# Patient Record
Sex: Male | Born: 1995
Health system: Southern US, Community
[De-identification: ages and names within clinical notes are randomized; demographics above are authoritative.]

## PROBLEM LIST (undated history)

## (undated) DIAGNOSIS — R1115 Cyclical vomiting syndrome unrelated to migraine: Secondary | ICD-10-CM

---
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
## 2018-07-15 ENCOUNTER — Other Ambulatory Visit: Payer: Self-pay

## 2018-07-15 ENCOUNTER — Emergency Department (HOSPITAL_BASED_OUTPATIENT_CLINIC_OR_DEPARTMENT_OTHER)
Admission: EM | Admit: 2018-07-15 | Discharge: 2018-07-15 | Disposition: A | Discharge status: Home / Self Care | Payer: Self-pay | Attending: Emergency Medicine | Admitting: Emergency Medicine

## 2018-07-15 ENCOUNTER — Encounter (HOSPITAL_BASED_OUTPATIENT_CLINIC_OR_DEPARTMENT_OTHER): Payer: Self-pay | Admitting: Student

## 2018-07-15 DIAGNOSIS — F17200 Nicotine dependence, unspecified, uncomplicated: Secondary | ICD-10-CM | POA: Insufficient documentation

## 2018-07-15 DIAGNOSIS — R112 Nausea with vomiting, unspecified: Secondary | ICD-10-CM | POA: Insufficient documentation

## 2018-07-15 MED ORDER — ONDANSETRON 4 MG PO TBDP: 4.0000 mg | ORAL_TABLET | Freq: Three times a day (TID) | ORAL | 0 refills | Status: DC | PRN

## 2018-07-15 MED ORDER — ONDANSETRON 4 MG PO TBDP
4.0000 mg | ORAL_TABLET | Freq: Once | ORAL | Status: AC
  Administered 2018-07-15: 4 mg via ORAL
  Filled 2018-07-15: qty 1

## 2018-07-15 MED ORDER — FAMOTIDINE 20 MG PO TABS: 20.0000 mg | ORAL_TABLET | Freq: Two times a day (BID) | ORAL | 0 refills | Status: DC | PRN

## 2018-07-15 MED FILL — FAMOTIDINE 20 MG TABLET: 20 | 5 days supply | Qty: 10 | Fill #0

## 2018-07-15 MED FILL — ONDANSETRON ODT 4 MG TABLET: 4 | 1 days supply | Qty: 5 | Fill #0

## 2018-07-15 NOTE — ED Provider Notes (Signed)
[facility] EMERGENCY DEPARTMENT Provider Note   CSN: [id number] Arrival date & time: 07/15/18  1154    History   Chief Complaint Chief Complaint  Patient presents with   Emesis    HPI Sean Benton is a 23 y.o. male without significant past medical hx who presents to the ED with complaints of N/V x  6 days. Patient notes that last Friday evening 03/20 he was consuming alcohol and smoking marijuana at a party and later that evening he had an episode of emesis. He states that since that evening each afternoon he has 2-4 episodes of non bloody, non bilious emesis. He admits to continued marijuana use several times since Friday evening. He is able to tolerate PO fluids throughout the morning though. He notes that he gets queezy w/ increased nausea prior to vomiting and has dyspnea during vomiting episodes otherwise he is not having any shortness of breath. Last BM was Friday and was normal, he states he hasn't kept down any food, he is still passing gas. Denies fever, chills, hematemesis, abdominal pain, chest pain, diarrhea, melena, hematochezia, dysuria, testicular pain/swelling, cough, fever or URI sxs. No one sick w/ simliar sxs. No recent travel, hospitalizations, or abx. No history of prior abdominal surgeries.      HPI  History reviewed. No pertinent past medical history.  There are no active problems to display for this patient.   History reviewed. No pertinent surgical history.      Home Medications    Prior to Admission medications   Not on File    Family History History reviewed. No pertinent family history.  Social History Social History   Tobacco Use   Smoking status: Current Every Day Smoker   Smokeless tobacco: Never Used  Substance Use Topics   Alcohol use: Yes   Drug use: Yes    Types: Marijuana     Allergies   Patient has no known allergies.   Review of Systems Review of Systems  Constitutional: Negative for chills and fever.    HENT: Negative for congestion, ear pain and sore throat.   Respiratory: Positive for shortness of breath (w/ vomiting otherwise none). Negative for cough, wheezing and stridor.   Cardiovascular: Negative for chest pain.  Gastrointestinal: Positive for nausea and vomiting. Negative for abdominal distention, abdominal pain, anal bleeding, blood in stool and diarrhea.  Genitourinary: Negative for dysuria, scrotal swelling and testicular pain.  All other systems reviewed and are negative.    Physical Exam Updated Vital Signs BP (!) 144/90    Pulse (!) 50    Temp 98.6 F (37 C) (Oral)    Resp 16    Ht 5\' 10"  (1.778 m)    Wt 83.9 kg    SpO2 100%    BMI 26.54 kg/m   Physical Exam Vitals signs and nursing note reviewed.  Constitutional:      General: He is not in acute distress.    Appearance: He is well-developed. He is not toxic-appearing.  HENT:     Head: Normocephalic and atraumatic.     Mouth/Throat:     Mouth: Mucous membranes are moist.  Eyes:     General:        Right eye: No discharge.        Left eye: No discharge.     Conjunctiva/sclera: Conjunctivae normal.  Neck:     Musculoskeletal: Neck supple.  Cardiovascular:     Rate and Rhythm: Regular rhythm. Bradycardia present.  Pulmonary:  Effort: Pulmonary effort is normal. No respiratory distress.     Breath sounds: Normal breath sounds. No wheezing, rhonchi or rales.  Abdominal:     General: There is no distension.     Palpations: Abdomen is soft.     Tenderness: There is no abdominal tenderness. There is no guarding or rebound.  Skin:    General: Skin is warm and dry.     Findings: No rash.  Neurological:     Mental Status: He is alert.     Comments: Clear speech.   Psychiatric:        Behavior: Behavior normal.      ED Treatments / Results  Labs (all labs ordered are listed, but only abnormal results are displayed) Labs Reviewed - No data to display  EKG None  Radiology No results  found.  Procedures Procedures (including critical care time)  Medications Ordered in ED Medications  ondansetron (ZOFRAN-ODT) disintegrating tablet 4 mg (has no administration in time range)     Initial Impression / Assessment and Plan / ED Course  I have reviewed the triage vital signs and the nursing notes.  Pertinent labs & imaging results that were available during my care of the patient were reviewed by me and considered in my medical decision making (see chart for details).   Patient presents to the ED w/ complaints of N/V since consuming EtOH and smoking marijuana 6 days prior. Patient nontoxic appearing, no apparent distress, vitals w/ mild bradycardia & HTN that appear similar to vitals and prior ER visit in January of this year. His reported shortness of breath is during his episodes of vomiting/dry heaving, does not seem like true dyspnea, no respiratory distress, normal SpO2/RR, lungs CTA. No chest pain, cough, or fever. He does not appear clinically dehydrated, mucous membranes are moist, he is not tachycardic or hypotensive- able to tolerate PO fluids each morning this week and drinking Gatorade, do not suspect significant electrolyte derangement. He has a nontender abdomen without peritoneal signs & is afebrile, doubt cholecystitis, pancreatitis, or appendicitis, while he has not had a bowel movement he continues to pass gas with nontender abdomen, doubt perforation, obstruction or other surgical cause. High suspicion for substance related emesis given EtOH & marijuana prior to onset w/ continued frequent marijuana use w/ continued vomiting, but no definitive etiology. He is tolerating PO in the emergency department without difficulty. Discussed findings & plan of care with supervising physician Dr. Adela Lank in agreement with discharge home with zofran & H2 blocker, no further ER work-up, and PCP follow up. I discussed treatment plan including medications & marijuana cessation, need for  follow-up, and return precautions with the patient. Provided opportunity for questions, patient confirmed understanding and is in agreement with plan.     Final Clinical Impressions(s) / ED Diagnoses   Final diagnoses:  Non-intractable vomiting with nausea, unspecified vomiting type    ED Discharge Orders         Ordered    ondansetron (ZOFRAN ODT) 4 MG disintegrating tablet  Every 8 hours PRN     07/15/18 1226    famotidine (PEPCID) 20 MG tablet  2 times daily PRN     07/15/18 [address] [place] R, PA-C 07/15/18 1230    Melene Plan, DO 07/15/18 1329

## 2018-07-15 NOTE — Discharge Instructions (Addendum)
You were seen in the ER today for nausea/vomiting.  Please stop using marijuana- see attached handout regarding marijuana use in relation to vomiting.  We are sending you home with zofran & pepcid to help with nausea/vomiting.  Take zofran every 8 hours as needed for nausea/vomiting.  Take pepcid once in the morning and once in the evening as needed for reflux/heartburn/indigestion.   We have prescribed you new medication(s) today. Discuss the medications prescribed today with your pharmacist as they can have adverse effects and interactions with your other medicines including over the counter and prescribed medications. Seek medical evaluation if you start to experience new or abnormal symptoms after taking one of these medicines, seek care immediately if you start to experience difficulty breathing, feeling of your throat closing, facial swelling, or rash as these could be indications of a more serious allergic reaction  Follow up with primary care within 3 days. Return to the ER for new or worsening symptoms including but not limited to inability to keep fluids down, abdominal pain, blood in vomit/stool, inability to pass gas, fever or any other concerns.   Also follow up with primary care for a blood pressure recheck as it is elevated today and has been with prior ER visits.  Blood pressure (!) 144/90, pulse (!) 50, temperature 98.6 F (37 C), temperature source Oral, resp. rate 16, height 5\' 10"  (1.778 m), weight 83.9 kg, SpO2 100 %.

## 2018-07-15 NOTE — ED Triage Notes (Signed)
Reports vomiting since Friday.  Denies diarrhea but states no bowel movement since Friday.  Reports that he has also had shortness of breath intermittently.  Ambulatory to room in NAD, drinking gatorade.  Explained to patient that he would need to remain NPO.

## 2018-08-30 ENCOUNTER — Other Ambulatory Visit: Payer: Self-pay

## 2018-08-30 ENCOUNTER — Encounter (HOSPITAL_BASED_OUTPATIENT_CLINIC_OR_DEPARTMENT_OTHER): Payer: Self-pay

## 2018-08-30 ENCOUNTER — Emergency Department (HOSPITAL_BASED_OUTPATIENT_CLINIC_OR_DEPARTMENT_OTHER)
Admission: EM | Admit: 2018-08-30 | Discharge: 2018-08-30 | Disposition: A | Discharge status: Home / Self Care | Payer: Self-pay | Attending: Emergency Medicine | Admitting: Emergency Medicine

## 2018-08-30 DIAGNOSIS — R1013 Epigastric pain: Secondary | ICD-10-CM | POA: Insufficient documentation

## 2018-08-30 DIAGNOSIS — R112 Nausea with vomiting, unspecified: Secondary | ICD-10-CM | POA: Insufficient documentation

## 2018-08-30 DIAGNOSIS — F17210 Nicotine dependence, cigarettes, uncomplicated: Secondary | ICD-10-CM | POA: Insufficient documentation

## 2018-08-30 DIAGNOSIS — R197 Diarrhea, unspecified: Secondary | ICD-10-CM | POA: Insufficient documentation

## 2018-08-30 LAB — COMPREHENSIVE METABOLIC PANEL
ALT: 16 U/L (ref 0–44)
AST: 24 U/L (ref 15–41)
Albumin: 4.7 g/dL (ref 3.5–5.0)
Alkaline Phosphatase: 62 U/L (ref 38–126)
Anion gap: 8 (ref 5–15)
BUN: 11 mg/dL (ref 6–20)
CO2: 22 mmol/L (ref 22–32)
Calcium: 9.4 mg/dL (ref 8.9–10.3)
Chloride: 108 mmol/L (ref 98–111)
Creatinine, Ser: 0.9 mg/dL (ref 0.61–1.24)
GFR calc Af Amer: >60 mL/min (ref >60)
GFR calc non Af Amer: >60 mL/min (ref >60)
Glucose, Bld: 137 mg/dL — ABNORMAL HIGH (ref 70–99)
Potassium: 3.6 mmol/L (ref 3.5–5.1)
Sodium: 138 mmol/L (ref 135–145)
Total Bilirubin: 0.6 mg/dL (ref 0.3–1.2)
Total Protein: 7.8 g/dL (ref 6.5–8.1)

## 2018-08-30 LAB — URINALYSIS, ROUTINE W REFLEX MICROSCOPIC
Bilirubin Urine: NEGATIVE
Glucose, UA: NEGATIVE mg/dL
Ketones, ur: NEGATIVE mg/dL
Leukocytes,Ua: NEGATIVE
Nitrite: NEGATIVE
Protein, ur: 100 mg/dL — AB
Specific Gravity, Urine: >1.03 — ABNORMAL HIGH (ref 1.005–1.030)
pH: 6.5 (ref 5.0–8.0)

## 2018-08-30 LAB — URINALYSIS, MICROSCOPIC (REFLEX)

## 2018-08-30 LAB — CBC WITH DIFFERENTIAL/PLATELET
Abs Immature Granulocytes: 0.02 10*3/uL (ref 0.00–0.07)
Basophils Absolute: 0 10*3/uL (ref 0.0–0.1)
Basophils Relative: 0 %
Eosinophils Absolute: 0 10*3/uL (ref 0.0–0.5)
Eosinophils Relative: 0 %
HCT: 40.6 % (ref 39.0–52.0)
Hemoglobin: 12.5 g/dL — ABNORMAL LOW (ref 13.0–17.0)
Immature Granulocytes: 0 %
Lymphocytes Relative: 9 %
Lymphs Abs: 0.7 10*3/uL (ref 0.7–4.0)
MCH: 22.9 pg — ABNORMAL LOW (ref 26.0–34.0)
MCHC: 30.8 g/dL (ref 30.0–36.0)
MCV: 74.2 fL — ABNORMAL LOW (ref 80.0–100.0)
Monocytes Absolute: 0.3 10*3/uL (ref 0.1–1.0)
Monocytes Relative: 4 %
Neutro Abs: 6.9 10*3/uL (ref 1.7–7.7)
Neutrophils Relative %: 87 %
Platelets: 268 10*3/uL (ref 150–400)
RBC: 5.47 MIL/uL (ref 4.22–5.81)
RDW: 15.3 % (ref 11.5–15.5)
WBC: 8 10*3/uL (ref 4.0–10.5)
nRBC: 0 % (ref 0.0–0.2)

## 2018-08-30 LAB — LIPASE, BLOOD: Lipase: 25 U/L (ref 11–51)

## 2018-08-30 MED ORDER — ONDANSETRON HCL 4 MG/2ML IJ SOLN
4.0000 mg | Freq: Once | INTRAMUSCULAR | Status: AC
  Administered 2018-08-30: 4 mg via INTRAVENOUS
  Filled 2018-08-30: qty 2

## 2018-08-30 MED ORDER — HALOPERIDOL LACTATE 5 MG/ML IJ SOLN
5.0000 mg | Freq: Once | INTRAMUSCULAR | Status: AC
  Administered 2018-08-30: 17:00:00 5 mg via INTRAVENOUS
  Filled 2018-08-30: qty 1

## 2018-08-30 MED ORDER — ONDANSETRON HCL 4 MG PO TABS: 4.0000 mg | ORAL_TABLET | Freq: Four times a day (QID) | ORAL | 0 refills | Status: DC

## 2018-08-30 MED ORDER — SODIUM CHLORIDE 0.9 % IV BOLUS
1000.0000 mL | Freq: Once | INTRAVENOUS | Status: AC
  Administered 2018-08-30: 16:00:00 1000 mL via INTRAVENOUS

## 2018-08-30 NOTE — Discharge Instructions (Signed)
Avoid smoking marijuana, this may be causing your symptoms. Drink plenty of water. Thank you for allowing me to care for you today. Please return to the emergency department if you have new or worsening symptoms. Take your medications as instructed.

## 2018-08-30 NOTE — ED Notes (Signed)
Pt still vomiting despite medication

## 2018-08-30 NOTE — ED Provider Notes (Signed)
[facility] EMERGENCY DEPARTMENT Provider Note   CSN: [id number] Arrival date & time: 08/30/18  1523    History   Chief Complaint Chief Complaint  Patient presents with   Abdominal Pain    HPI Sean Benton is a 23 y.o. male.     23 year old male with no significant past medical history presenting to the emergency department for abdominal pain, nausea vomiting, diarrhea.  Reports that this started abruptly this morning.  Reports history of the same in the past which was thought to be related to [facility] hyperemesis.  He reports that he smokes marijuana twice a day.  Reports that he had several episodes of nonbloody stool and emesis.  Reports that he has some epigastric pain.  Denies any fever, sick contacts, cough, dysuria.     History reviewed. No pertinent past medical history.  There are no active problems to display for this patient.   History reviewed. No pertinent surgical history.      Home Medications    Prior to Admission medications   Medication Sig Start Date End Date Taking? Authorizing Provider  famotidine (PEPCID) 20 MG tablet Take 1 tablet (20 mg total) by mouth 2 (two) times daily as needed for heartburn or indigestion. 07/15/18   Petrucelli, Samantha R, PA-C  ondansetron (ZOFRAN ODT) 4 MG disintegrating tablet Take 1 tablet (4 mg total) by mouth every 8 (eight) hours as needed for nausea or vomiting. 07/15/18   Petrucelli, Samantha R, PA-C  ondansetron (ZOFRAN) 4 MG tablet Take 1 tablet (4 mg total) by mouth every 6 (six) hours. 08/30/18   Arlyn Dunning, PA-C    Family History No family history on file.  Social History Social History   Tobacco Use   Smoking status: Current Every Day Smoker    Types: Cigarettes   Smokeless tobacco: Never Used  Substance Use Topics   Alcohol use: Yes    Comment: occ   Drug use: Yes    Types: Marijuana     Allergies   Patient has no known allergies.   Review of Systems Review of Systems    Constitutional: Positive for appetite change. Negative for activity change, fatigue and fever.  HENT: Negative for congestion and rhinorrhea.   Respiratory: Negative for cough and shortness of breath.   Cardiovascular: Negative for chest pain and palpitations.  Gastrointestinal: Positive for abdominal pain, diarrhea, nausea and vomiting. Negative for abdominal distention, anal bleeding, blood in stool, constipation and rectal pain.  Genitourinary: Negative for discharge, dysuria, hematuria and testicular pain.  Musculoskeletal: Negative for arthralgias and back pain.  Skin: Negative for rash.  Allergic/Immunologic: Negative for immunocompromised state.  Neurological: Negative for dizziness, light-headedness and headaches.     Physical Exam Updated Vital Signs BP 137/71 (BP Location: Right Arm)    Pulse (!) 50    Temp 97.9 F (36.6 C) (Oral)    Resp 20    Ht  (1.778 m)    Wt 83.9 kg    SpO2 99%    BMI 26.54 kg/m   Physical Exam Vitals signs and nursing note reviewed.  Constitutional:      Appearance: He is well-developed.  HENT:     Head: Normocephalic and atraumatic.  Eyes:     Conjunctiva/sclera: Conjunctivae normal.  Neck:     Musculoskeletal: Neck supple.  Cardiovascular:     Rate and Rhythm: Normal rate and regular rhythm.     Heart sounds: No murmur.  Pulmonary:     Effort: Pulmonary  effort is normal. No respiratory distress.     Breath sounds: Normal breath sounds.  Abdominal:     Palpations: Abdomen is soft.     Tenderness: There is generalized abdominal tenderness and tenderness in the epigastric area. There is no right CVA tenderness, left CVA tenderness, guarding or rebound. Negative signs include Murphy's sign and McBurney's sign.  Skin:    General: Skin is warm and dry.  Neurological:     Mental Status: He is alert.  Psychiatric:        Mood and Affect: Mood normal.      ED Treatments / Results  Labs (all labs ordered are listed, but only abnormal  results are displayed) Labs Reviewed  URINALYSIS, ROUTINE W REFLEX MICROSCOPIC - Abnormal; Notable for the following components:      Result Value   Specific Gravity, Urine >1.030 (*)    Hgb urine dipstick TRACE (*)    Protein, ur 100 (*)    All other components within normal limits  URINALYSIS, MICROSCOPIC (REFLEX) - Abnormal; Notable for the following components:   Bacteria, UA FEW (*)    All other components within normal limits  CBC WITH DIFFERENTIAL/PLATELET - Abnormal; Notable for the following components:   Hemoglobin 12.5 (*)    MCV 74.2 (*)    MCH 22.9 (*)    All other components within normal limits  COMPREHENSIVE METABOLIC PANEL - Abnormal; Notable for the following components:   Glucose, Bld 137 (*)    All other components within normal limits  LIPASE, BLOOD  CBC WITH DIFFERENTIAL/PLATELET    EKG None  Radiology No results found.  Procedures Procedures (including critical care time)  Medications Ordered in ED Medications  ondansetron (ZOFRAN) injection 4 mg (4 mg Intravenous Given 08/30/18 1605)  sodium chloride 0.9 % bolus 1,000 mL (1,000 mLs Intravenous New Bag/Given 08/30/18 1606)  haloperidol lactate (HALDOL) injection 5 mg (5 mg Intravenous Given 08/30/18 1650)     Initial Impression / Assessment and Plan / ED Course  I have reviewed the triage vital signs and the nursing notes.  Pertinent labs & imaging results that were available during my care of the patient were reviewed by me and considered in my medical decision making (see chart for details).  Clinical Course as of Aug 29 1713  Mon Aug 30, 2018  [id number] 23 year old with no past medical history presenting with nausea, vomiting, diarrhea which started abruptly since this morning.  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  Reports smoking marijuana 2 times every day.  On my exam belly is very soft and nontender.  I will  obtain labs, give zofran   [KM]  1711 Patient received zofan and haldol and now is improved without vomiting. Labs are unremarkable and belly remains soft and nontender. Suspect gastroenteritis vs THC hyperemesis. Will d/c with zofran and strict return precautions   [KM]    Clinical Course User Index [KM] Arlyn DunningMcLean, Yannis Broce A, PA-C      Based on review of vitals, medical screening exam, lab work and/or imaging, there does not appear to be an acute, emergent etiology for the patient's symptoms. Counseled pt on good return precautions and encouraged both PCP and ED follow-up as needed.  Prior to discharge, I also discussed incidental imaging findings with patient in detail and advised appropriate, recommended follow-up in detail.  Clinical Impression: 1. Non-intractable vomiting with nausea, unspecified vomiting type  Disposition: Discharge  Prior to providing a prescription for a controlled substance, I independently reviewed the patient's recent prescription history on the [place] Controlled Substance Reporting System. The patient had no recent or regular prescriptions and was deemed appropriate for a brief, less than 3 day prescription of narcotic for acute analgesia.  This note was prepared with assistance of Conservation officer, historic buildings. Occasional wrong-word or sound-a-like substitutions may have occurred due to the inherent limitations of voice recognition software.   Final Clinical Impressions(s) / ED Diagnoses   Final diagnoses:  Non-intractable vomiting with nausea, unspecified vomiting type    ED Discharge Orders         Ordered    ondansetron (ZOFRAN) 4 MG tablet  Every 6 hours     08/30/18 1714           Jeral Pinch 08/30/18 1715    Little, Ambrose Finland, MD 08/30/18 1724

## 2018-08-30 NOTE — ED Notes (Signed)
Pt has been seen several times for the same, c/o n/v with heavy marijuana use. Pt is able to hold down water at home, neglected to try any of the medication prescriptions that have been given to him prior to coming into the ED. Pt asking how long he has to be in the ED, nurse updated pt to plan of care

## 2018-08-30 NOTE — ED Triage Notes (Signed)
C/o abd pain n/v/d x today-to triage in w/c

## 2018-08-30 NOTE — ED Notes (Signed)
Pt left without his discharge instructions despite nurse explaining his discharge instructions, denies any needs at this time

## 2018-11-11 ENCOUNTER — Emergency Department (HOSPITAL_BASED_OUTPATIENT_CLINIC_OR_DEPARTMENT_OTHER)
Admission: EM | Admit: 2018-11-11 | Discharge: 2018-11-11 | Disposition: A | Discharge status: Home / Self Care | Payer: HRSA Program | Attending: Emergency Medicine | Admitting: Emergency Medicine

## 2018-11-11 ENCOUNTER — Emergency Department (HOSPITAL_BASED_OUTPATIENT_CLINIC_OR_DEPARTMENT_OTHER): Payer: HRSA Program

## 2018-11-11 ENCOUNTER — Encounter (HOSPITAL_BASED_OUTPATIENT_CLINIC_OR_DEPARTMENT_OTHER): Payer: Self-pay | Admitting: *Deleted

## 2018-11-11 ENCOUNTER — Other Ambulatory Visit: Payer: Self-pay

## 2018-11-11 DIAGNOSIS — R0602 Shortness of breath: Secondary | ICD-10-CM

## 2018-11-11 DIAGNOSIS — Z20828 Contact with and (suspected) exposure to other viral communicable diseases: Secondary | ICD-10-CM | POA: Diagnosis not present

## 2018-11-11 DIAGNOSIS — R112 Nausea with vomiting, unspecified: Secondary | ICD-10-CM | POA: Insufficient documentation

## 2018-11-11 DIAGNOSIS — R1084 Generalized abdominal pain: Secondary | ICD-10-CM | POA: Insufficient documentation

## 2018-11-11 DIAGNOSIS — F17210 Nicotine dependence, cigarettes, uncomplicated: Secondary | ICD-10-CM | POA: Diagnosis not present

## 2018-11-11 LAB — CBC WITH DIFFERENTIAL/PLATELET
Abs Immature Granulocytes: 0.06 10*3/uL (ref 0.00–0.07)
Basophils Absolute: 0 10*3/uL (ref 0.0–0.1)
Basophils Relative: 0 %
Eosinophils Absolute: 0 10*3/uL (ref 0.0–0.5)
Eosinophils Relative: 0 %
HCT: 48 % (ref 39.0–52.0)
Hemoglobin: 14.9 g/dL (ref 13.0–17.0)
Immature Granulocytes: 1 %
Lymphocytes Relative: 19 %
Lymphs Abs: 1.4 10*3/uL (ref 0.7–4.0)
MCH: 22.7 pg — ABNORMAL LOW (ref 26.0–34.0)
MCHC: 31 g/dL (ref 30.0–36.0)
MCV: 73.3 fL — ABNORMAL LOW (ref 80.0–100.0)
Monocytes Absolute: 0.8 10*3/uL (ref 0.1–1.0)
Monocytes Relative: 11 %
Neutro Abs: 4.9 10*3/uL (ref 1.7–7.7)
Neutrophils Relative %: 69 %
Platelets: 336 10*3/uL (ref 150–400)
RBC: 6.55 MIL/uL — ABNORMAL HIGH (ref 4.22–5.81)
RDW: 15.7 % — ABNORMAL HIGH (ref 11.5–15.5)
WBC: 7.1 10*3/uL (ref 4.0–10.5)
nRBC: 0 % (ref 0.0–0.2)

## 2018-11-11 LAB — COMPREHENSIVE METABOLIC PANEL
ALT: 12 U/L (ref 0–44)
AST: 15 U/L (ref 15–41)
Albumin: 5 g/dL (ref 3.5–5.0)
Alkaline Phosphatase: 73 U/L (ref 38–126)
Anion gap: 16 — ABNORMAL HIGH (ref 5–15)
BUN: 17 mg/dL (ref 6–20)
CO2: 27 mmol/L (ref 22–32)
Calcium: 10.2 mg/dL (ref 8.9–10.3)
Chloride: 92 mmol/L — ABNORMAL LOW (ref 98–111)
Creatinine, Ser: 1.25 mg/dL — ABNORMAL HIGH (ref 0.61–1.24)
GFR calc Af Amer: >60 mL/min (ref >60)
GFR calc non Af Amer: >60 mL/min (ref >60)
Glucose, Bld: 108 mg/dL — ABNORMAL HIGH (ref 70–99)
Potassium: 3.1 mmol/L — ABNORMAL LOW (ref 3.5–5.1)
Sodium: 135 mmol/L (ref 135–145)
Total Bilirubin: 1.1 mg/dL (ref 0.3–1.2)
Total Protein: 8.8 g/dL — ABNORMAL HIGH (ref 6.5–8.1)

## 2018-11-11 LAB — LIPASE, BLOOD: Lipase: 30 U/L (ref 11–51)

## 2018-11-11 MED ORDER — SODIUM CHLORIDE 0.9 % IV BOLUS
1000.0000 mL | Freq: Once | INTRAVENOUS | Status: AC
  Administered 2018-11-11: 1000 mL via INTRAVENOUS

## 2018-11-11 MED ORDER — ONDANSETRON 4 MG PO TBDP: 4.0000 mg | ORAL_TABLET | Freq: Three times a day (TID) | ORAL | 0 refills | Status: DC | PRN

## 2018-11-11 MED ORDER — ONDANSETRON HCL 4 MG/2ML IJ SOLN
4.0000 mg | Freq: Once | INTRAMUSCULAR | Status: AC
  Administered 2018-11-11: 21:00:00 4 mg via INTRAVENOUS
  Filled 2018-11-11: qty 2

## 2018-11-11 NOTE — ED Provider Notes (Signed)
Emergency Department Provider Note   I have reviewed the triage vital signs and the nursing notes.   HISTORY  Chief Complaint Abdominal Pain and Emesis   HPI Sean Benton is a 23 y.o. male with PMH hyperemesis presents to the emergency department with nausea, vomiting, abdominal discomfort.  Patient states he is developed some mild shortness of breath and that symptoms of been present over the last week.  Symptoms began after eating a possibly [facility]ed [facility].  He states that he stop smoking marijuana approximately 1 week ago.  He has not experienced any chest pain but has had some mild dyspnea.  No cough, fever, anosmia.  No known COVID contacts.  He attempted to return to work this evening but was sent home and told he could not return until his COVID test had resulted.   History reviewed. No pertinent past medical history.  There are no active problems to display for this patient.   History reviewed. No pertinent surgical history.  Allergies Patient has no known allergies.  No family history on file.  Social History Social History   Tobacco Use  . Smoking status: Current Every Day Smoker    Types: Cigarettes  . Smokeless tobacco: Never Used  Substance Use Topics  . Alcohol use: Yes    Comment: occ  . Drug use: Yes    Types: Marijuana    Review of Systems  Constitutional: No fever/chills Eyes: No visual changes. ENT: No sore throat. Cardiovascular: Denies chest pain. Respiratory: Positive intermittent shortness of breath. Gastrointestinal: Positive abdominal pain. Positive nausea and vomiting.  No diarrhea.  No constipation. Genitourinary: Negative for dysuria. Musculoskeletal: Negative for back pain. Skin: Negative for rash. Neurological: Negative for headaches, focal weakness or numbness.  10-point ROS otherwise negative.  ____________________________________________   PHYSICAL EXAM:  VITAL SIGNS: ED Triage Vitals  Enc Vitals  Group     BP 11/11/18 2024 121/81     Pulse Rate 11/11/18 2024 (!) 54     Resp 11/11/18 2024 18     Temp 11/11/18 2024 98.6 F (37 C)     Temp Source 11/11/18 2024 Oral     SpO2 11/11/18 2024 100 %     Weight 11/11/18 2022 165 lb (74.8 kg)     Height 11/11/18 2022 5' 9.5" (1.765 m)   Constitutional: Alert and oriented. Well appearing and in no acute distress. Eyes: Conjunctivae are normal.  Head: Atraumatic. Nose: No congestion/rhinnorhea. Mouth/Throat: Mucous membranes are moist.  Neck: No stridor.  Cardiovascular: Normal rate, regular rhythm. Good peripheral circulation. Grossly normal heart sounds.   Respiratory: Normal respiratory effort.  No retractions. Lungs CTAB. Gastrointestinal: Soft and nontender. No distention.  Musculoskeletal: No lower extremity tenderness nor edema. No gross deformities of extremities. Neurologic:  Normal speech and language. No gross focal neurologic deficits are appreciated.  Skin:  Skin is warm, dry and intact. No rash noted.  ____________________________________________   LABS (all labs ordered are listed, but only abnormal results are displayed)  Labs Reviewed  COMPREHENSIVE METABOLIC PANEL - Abnormal; Notable for the following components:      Result Value   Potassium 3.1 (*)    Chloride 92 (*)    Glucose, Bld 108 (*)    Creatinine, Ser 1.25 (*)    Total Protein 8.8 (*)    Anion gap 16 (*)    All other components within normal limits  CBC WITH DIFFERENTIAL/PLATELET - Abnormal; Notable for the following components:   RBC 6.55 (*)  MCV 73.3 (*)    MCH 22.7 (*)    RDW 15.7 (*)    All other components within normal limits  NOVEL CORONAVIRUS, NAA (HOSPITAL ORDER, SEND-OUT TO REF LAB)  LIPASE, BLOOD   ____________________________________________  RADIOLOGY  Dg Chest Portable 1 View  Result Date: 11/11/2018 CLINICAL DATA:  Shortness of breath. Vomiting. EXAM: PORTABLE CHEST 1 VIEW COMPARISON:  Chest radiograph 08/27/2018 FINDINGS:  The cardiomediastinal contours are normal. The lungs are clear. Pulmonary vasculature is normal. No consolidation, pleural effusion, or pneumothorax. No acute osseous abnormalities are seen. IMPRESSION: Negative AP view of the chest. Electronically Signed   By: Keith Rake M.D.   On: 11/11/2018 21:43    ____________________________________________   PROCEDURES  Procedure(s) performed:   Procedures  None ____________________________________________   INITIAL IMPRESSION / ASSESSMENT AND PLAN / ED COURSE  Pertinent labs & imaging results that were available during my care of the patient were reviewed by me and considered in my medical decision making (see chart for details).    Patient presents to the emergency department for evaluation of abdominal pain with nausea vomiting.  He stopped smoking marijuana only 1 week ago.  Suspect that this is related to marijuana and we discussed this.  Also with some nonspecific, intermittent shortness of breath.  His work is requesting a COVID test which I plan to provide is a send out.  Plan for screening labs, IV fluids, Zofran and reassess.   Patient feeling improved after IVF and Zofran. COVID test sent and patient to f/u with result in MyChart. Information provided regarding app setup at discharge. Patient to isolate and distance fully until result is back and is feeling better for at least 3 days. Patient verbalizes understanding. Discussed ED return precautions.   Sean Benton was evaluated in Emergency Department on 11/12/2018 for the symptoms described in the history of present illness. He was evaluated in the context of the global COVID-19 pandemic, which necessitated consideration that the patient might be at risk for infection with the SARS-CoV-2 virus that causes COVID-19. Institutional protocols and algorithms that pertain to the evaluation of patients at risk for COVID-19 are in a state of rapid change based on information released by  regulatory bodies including the CDC and federal and state organizations. These policies and algorithms were followed during the patient's care in the ED.  ____________________________________________  FINAL CLINICAL IMPRESSION(S) / ED DIAGNOSES  Final diagnoses:  Nausea vomiting and diarrhea  Generalized abdominal pain  SOB (shortness of breath)     MEDICATIONS GIVEN DURING THIS VISIT:  Medications  sodium chloride 0.9 % bolus 1,000 mL (0 mLs Intravenous Stopped 11/11/18 2208)  ondansetron (ZOFRAN) injection 4 mg (4 mg Intravenous Given 11/11/18 2104)    Note:  This document was prepared using Dragon voice recognition software and may include unintentional dictation errors.  Nanda Quinton, MD Emergency Medicine    Elyssia Strausser, [employer] [employer], MD 11/12/18 1949

## 2018-11-11 NOTE — ED Notes (Signed)
Pt. Reports he has had vomiting off and on since last Thursday.  Pt. Reports he was sent home on last Friday.  Pt. Went to work today and was sent home from work due to vomiting and was told he needs the COVID test before he can return to work.  Pt. Said he came straight here due to being sent home from work and being told he needed to be tested in order to return to work.

## 2018-11-11 NOTE — ED Triage Notes (Signed)
Abdominal pain and vomiting on and off for "a long time". 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 Someone he works with has been diagnosed with Covid.

## 2018-11-11 NOTE — Discharge Instructions (Signed)
You were seen in the emergency department today with abdominal pain.  Your lab work is normal.  Please take the Zofran as needed for nausea.  I have tested you for COVID-19.  You may follow the results and MyChart.  You should remain and home quarantine until your test comes back negative and you are feeling better.  Return to the emergency department with any new or worsening symptoms.

## 2018-11-12 MED FILL — ONDANSETRON ODT 4 MG TABLET: 4 | 7 days supply | Qty: 20 | Fill #0

## 2018-11-13 LAB — NOVEL CORONAVIRUS, NAA (HOSP ORDER, SEND-OUT TO REF LAB; TAT 18-24 HRS): SARS-CoV-2, NAA: NOT DETECTED

## 2019-09-07 ENCOUNTER — Encounter (HOSPITAL_BASED_OUTPATIENT_CLINIC_OR_DEPARTMENT_OTHER): Payer: Self-pay

## 2019-09-07 ENCOUNTER — Other Ambulatory Visit: Payer: Self-pay

## 2019-09-07 ENCOUNTER — Emergency Department (HOSPITAL_BASED_OUTPATIENT_CLINIC_OR_DEPARTMENT_OTHER)
Admission: EM | Admit: 2019-09-07 | Discharge: 2019-09-07 | Disposition: A | Discharge status: Home / Self Care | Payer: Medicaid Other | Attending: Emergency Medicine | Admitting: Emergency Medicine

## 2019-09-07 DIAGNOSIS — R1011 Right upper quadrant pain: Secondary | ICD-10-CM

## 2019-09-07 DIAGNOSIS — R197 Diarrhea, unspecified: Secondary | ICD-10-CM

## 2019-09-07 DIAGNOSIS — Z79899 Other long term (current) drug therapy: Secondary | ICD-10-CM | POA: Insufficient documentation

## 2019-09-07 DIAGNOSIS — R112 Nausea with vomiting, unspecified: Secondary | ICD-10-CM | POA: Insufficient documentation

## 2019-09-07 LAB — CBC
HCT: 47.3 % (ref 39.0–52.0)
Hemoglobin: 15.2 g/dL (ref 13.0–17.0)
MCH: 23 pg — ABNORMAL LOW (ref 26.0–34.0)
MCHC: 32.1 g/dL (ref 30.0–36.0)
MCV: 71.7 fL — ABNORMAL LOW (ref 80.0–100.0)
Platelets: 365 10*3/uL (ref 150–400)
RBC: 6.6 MIL/uL — ABNORMAL HIGH (ref 4.22–5.81)
RDW: 17 % — ABNORMAL HIGH (ref 11.5–15.5)
WBC: 10.2 10*3/uL (ref 4.0–10.5)
nRBC: 0 % (ref 0.0–0.2)

## 2019-09-07 LAB — COMPREHENSIVE METABOLIC PANEL
ALT: 16 U/L (ref 0–44)
AST: 25 U/L (ref 15–41)
Albumin: 5.6 g/dL — ABNORMAL HIGH (ref 3.5–5.0)
Alkaline Phosphatase: 97 U/L (ref 38–126)
Anion gap: 16 — ABNORMAL HIGH (ref 5–15)
BUN: 17 mg/dL (ref 6–20)
CO2: 23 mmol/L (ref 22–32)
Calcium: 10.4 mg/dL — ABNORMAL HIGH (ref 8.9–10.3)
Chloride: 101 mmol/L (ref 98–111)
Creatinine, Ser: 1.32 mg/dL — ABNORMAL HIGH (ref 0.61–1.24)
GFR calc Af Amer: >60 mL/min (ref >60)
GFR calc non Af Amer: >60 mL/min (ref >60)
Glucose, Bld: 128 mg/dL — ABNORMAL HIGH (ref 70–99)
Potassium: 4.4 mmol/L (ref 3.5–5.1)
Sodium: 140 mmol/L (ref 135–145)
Total Bilirubin: 0.7 mg/dL (ref 0.3–1.2)
Total Protein: 9.5 g/dL — ABNORMAL HIGH (ref 6.5–8.1)

## 2019-09-07 LAB — LIPASE, BLOOD: Lipase: 38 U/L (ref 11–51)

## 2019-09-07 LAB — URINALYSIS, ROUTINE W REFLEX MICROSCOPIC
Glucose, UA: NEGATIVE mg/dL
Ketones, ur: 15 mg/dL — AB
Nitrite: NEGATIVE
Protein, ur: >300 mg/dL — AB
Specific Gravity, Urine: >1.03 — ABNORMAL HIGH (ref 1.005–1.030)
pH: 6 (ref 5.0–8.0)

## 2019-09-07 LAB — URINALYSIS, MICROSCOPIC (REFLEX)

## 2019-09-07 MED ORDER — ONDANSETRON HCL 4 MG PO TABS
4.0000 mg | ORAL_TABLET | Freq: Three times a day (TID) | ORAL | 0 refills | Status: DC | PRN
Start: 2019-09-07 — End: 2020-04-04

## 2019-09-07 MED ORDER — PROMETHAZINE HCL 25 MG/ML IJ SOLN
25.0000 mg | Freq: Once | INTRAMUSCULAR | Status: AC
  Administered 2019-09-07: 25 mg via INTRAVENOUS
  Filled 2019-09-07: qty 1

## 2019-09-07 MED ORDER — SODIUM CHLORIDE 0.9 % IV BOLUS
1000.0000 mL | Freq: Once | INTRAVENOUS | Status: AC
  Administered 2019-09-07: 1000 mL via INTRAVENOUS

## 2019-09-07 MED ORDER — ONDANSETRON HCL 4 MG/2ML IJ SOLN
4.0000 mg | Freq: Once | INTRAMUSCULAR | Status: AC
  Administered 2019-09-07: 4 mg via INTRAVENOUS
  Filled 2019-09-07: qty 2

## 2019-09-07 MED ORDER — KETOROLAC TROMETHAMINE 30 MG/ML IJ SOLN
30.0000 mg | Freq: Once | INTRAMUSCULAR | Status: AC
  Administered 2019-09-07: 30 mg via INTRAVENOUS
  Filled 2019-09-07: qty 1

## 2019-09-07 MED ORDER — SODIUM CHLORIDE 0.9% FLUSH
3.0000 mL | Freq: Once | INTRAVENOUS | Status: DC
  Filled 2019-09-07: qty 3

## 2019-09-07 NOTE — ED Notes (Signed)
Pt tolerated a few sips of Ginger Ale.

## 2019-09-07 NOTE — ED Triage Notes (Signed)
Pt c/o abd pain, n/v/d x 2 days-NAD-steady gait

## 2019-09-07 NOTE — ED Notes (Signed)
Ginger Ale provided for po challenge 

## 2019-09-07 NOTE — Discharge Instructions (Addendum)
You were seen in the emergency department for nausea vomiting and upper abdominal pain.  That did not show any specific answers for your symptoms.  Your symptoms improved with fluids and some nausea medication.  We are prescribing some nausea medication if your symptoms persist.  Please return to the emergency department for any worsening or concerning symptoms

## 2019-09-07 NOTE — ED Provider Notes (Signed)
[facility] Provider Note   CSN: [id number] Arrival date & time: 09/07/19  1359     History Chief Complaint  Patient presents with  . Abdominal Pain    Sean Benton is a 24 y.o. male.  He is complaining of 2 or 3 days of upper abdominal pain more to the right, nausea and vomiting.  Feels generally weak.  Few episodes of loose stool.  No fevers or chills.  Feels short of breath at times.  No chest pain or cough.  No urinary symptoms.  No prior surgical history.  The history is provided by the patient.  Abdominal Pain Pain location:  RUQ Pain quality: cramping   Pain radiates to:  Does not radiate Pain severity:  Moderate Onset quality:  Gradual Timing:  Intermittent Progression:  Unchanged Chronicity:  New Context: retching   Context: not recent travel, not suspicious food intake and not trauma   Relieved by:  None tried Worsened by:  Nothing Ineffective treatments:  None tried Associated symptoms: diarrhea, fatigue, nausea, shortness of breath and vomiting   Associated symptoms: no chest pain, no chills, no constipation, no cough, no dysuria, no fever, no hematemesis, no hematochezia, no hematuria, no melena and no sore throat        History reviewed. No pertinent past medical history.  There are no problems to display for this patient.   History reviewed. No pertinent surgical history.     No family history on file.  Social History   Tobacco Use  . Smoking status: Never Smoker  . Smokeless tobacco: Never Used  Substance Use Topics  . Alcohol use: Not Currently  . Drug use: Yes    Types: Marijuana    Home Medications Prior to Admission medications   Medication Sig Start Date End Date Taking? Authorizing Provider  famotidine (PEPCID) 20 MG tablet Take 1 tablet (20 mg total) by mouth 2 (two) times daily as needed for heartburn or indigestion. 07/15/18   Petrucelli, Samantha R, PA-C  ondansetron (ZOFRAN ODT) 4 MG  disintegrating tablet Take 1 tablet (4 mg total) by mouth every 8 (eight) hours as needed for nausea or vomiting. 11/11/18   Long, Wonda Olds, MD  ondansetron (ZOFRAN) 4 MG tablet Take 1 tablet (4 mg total) by mouth every 6 (six) hours. 08/30/18   Alveria Apley, PA-C    Allergies    Patient has no known allergies.  Review of Systems   Review of Systems  Constitutional: Positive for fatigue. Negative for chills and fever.  HENT: Negative for sore throat.   Eyes: Negative for visual disturbance.  Respiratory: Positive for shortness of breath. Negative for cough.   Cardiovascular: Negative for chest pain.  Gastrointestinal: Positive for abdominal pain, diarrhea, nausea and vomiting. Negative for constipation, hematemesis, hematochezia and melena.  Genitourinary: Negative for dysuria and hematuria.  Musculoskeletal: Negative for neck pain.  Skin: Negative for rash.  Neurological: Negative for headaches.    Physical Exam Updated Vital Signs BP (!) 146/86 (BP Location: Right Arm)   Pulse (!) 55   Temp 98.4 F (36.9 C) (Oral)   Resp 16   Ht 5\' 9"  (1.753 m)   Wt 71.2 kg   SpO2 100%   BMI 23.18 kg/m   Physical Exam Vitals and nursing note reviewed.  Constitutional:      Appearance: He is well-developed.  HENT:     Head: Normocephalic and atraumatic.  Eyes:     Conjunctiva/sclera: Conjunctivae normal.  Cardiovascular:  Rate and Rhythm: Normal rate and regular rhythm.     Heart sounds: No murmur.  Pulmonary:     Effort: Pulmonary effort is normal. No respiratory distress.     Breath sounds: Normal breath sounds.  Abdominal:     Palpations: Abdomen is soft.     Tenderness: There is abdominal tenderness in the right upper quadrant.  Musculoskeletal:        General: No deformity or signs of injury. Normal range of motion.     Cervical back: Neck supple.  Skin:    General: Skin is warm and dry.     Capillary Refill: Capillary refill takes less than 2 seconds.  Neurological:      General: No focal deficit present.     Mental Status: He is alert.     ED Results / Procedures / Treatments   Labs (all labs ordered are listed, but only abnormal results are displayed) Labs Reviewed  COMPREHENSIVE METABOLIC PANEL - Abnormal; Notable for the following components:      Result Value   Glucose, Bld 128 (*)    Creatinine, Ser 1.32 (*)    Calcium 10.4 (*)    Total Protein 9.5 (*)    Albumin 5.6 (*)    Anion gap 16 (*)    All other components within normal limits  CBC - Abnormal; Notable for the following components:   RBC 6.60 (*)    MCV 71.7 (*)    MCH 23.0 (*)    RDW 17.0 (*)    All other components within normal limits  URINALYSIS, ROUTINE W REFLEX MICROSCOPIC - Abnormal; Notable for the following components:   APPearance CLOUDY (*)    Specific Gravity, Urine >1.030 (*)    Hgb urine dipstick MODERATE (*)    Bilirubin Urine SMALL (*)    Ketones, ur 15 (*)    Protein, ur >300 (*)    Leukocytes,Ua SMALL (*)    All other components within normal limits  URINALYSIS, MICROSCOPIC (REFLEX) - Abnormal; Notable for the following components:   Bacteria, UA RARE (*)    All other components within normal limits  LIPASE, BLOOD    EKG None  Radiology No results found.  Procedures Ultrasound ED Abd  Date/Time: 09/07/2019 5:36 PM Performed by: Terrilee Files, MD Authorized by: Terrilee Files, MD   Procedure details:    Indications: abdominal pain     Assessment for:  Gallstones   Right renal:  Visualized   Hepatobiliary:  Visualized       Right renal findings:    Perinephric fluid: not identified     Hydronephrosis: none   Hepatobiliary findings:    Gallbladder wall:  Normal   Gallbladder stones: not identified     Mass: not identified     Intra-abdominal fluid: not identified     Polyps: not identified     Sonographic Murphy's sign: negative     (including critical care time)  Medications Ordered in ED Medications  sodium chloride  flush (NS) 0.9 % injection 3 mL (has no administration in time range)  sodium chloride 0.9 % bolus 1,000 mL (has no administration in time range)  ondansetron (ZOFRAN) injection 4 mg (has no administration in time range)  ketorolac (TORADOL) 30 MG/ML injection 30 mg (has no administration in time range)    ED Course  I have reviewed the triage vital signs and the nursing notes.  Pertinent labs & imaging results that were available during my care of the  patient were reviewed by me and considered in my medical decision making (see chart for details).  Clinical Course as of Sep 08 903  Wed Sep 07, 2019  1832 Pain is improved.  He said he feels like some hiccups are coming on which he gets before he vomits.  Will redose with some antiemetic.   [MB]  1933 Patient sleeping.   [MB]    Clinical Course User Index [MB] Terrilee Files, MD   MDM Rules/Calculators/A&P                     This patient complains of upper abdominal pain vomiting; this involves an extensive number of treatment Options and is a complaint that carries with it a high risk of complications and Morbidity. The differential includes.  Colic, gastritis, peptic ulcer disease, dehydration, metabolic derangement  I ordered, reviewed and interpreted labs, which included CBC with a normal white count normal hemoglobin.  Chemistries with a mildly elevated creatinine and glucose.  Urinalysis concentrated with possible signs of infection I ordered medication IV fluids Zofran Phenergan I performed a bedside ultrasound and did not see any evidence of gallstones or gallbladder wall thickening, negative sonographic Murphy's Previous records obtained and reviewed in epic  After the interventions stated above, I reevaluated the patient and found patient to be symptomatically improved.  He is tolerating p.o.  Return instructions discussed.   Final Clinical Impression(s) / ED Diagnoses Final diagnoses:  Nausea vomiting and diarrhea    Right upper quadrant abdominal pain    Rx / DC Orders ED Discharge Orders         Ordered    ondansetron (ZOFRAN) 4 MG tablet  Every 8 hours PRN     09/07/19 2213           Terrilee Files, MD 09/08/19 [phone]

## 2019-09-08 MED FILL — ONDANSETRON HCL 4 MG TABLET: 4 | 5 days supply | Qty: 15 | Fill #0

## 2019-09-15 ENCOUNTER — Other Ambulatory Visit: Payer: Self-pay

## 2019-09-15 ENCOUNTER — Emergency Department (HOSPITAL_BASED_OUTPATIENT_CLINIC_OR_DEPARTMENT_OTHER): Payer: Self-pay

## 2019-09-15 ENCOUNTER — Emergency Department (HOSPITAL_BASED_OUTPATIENT_CLINIC_OR_DEPARTMENT_OTHER)
Admission: EM | Admit: 2019-09-15 | Discharge: 2019-09-15 | Disposition: A | Discharge status: Home / Self Care | Payer: Self-pay | Attending: Emergency Medicine | Admitting: Emergency Medicine

## 2019-09-15 ENCOUNTER — Encounter (HOSPITAL_BASED_OUTPATIENT_CLINIC_OR_DEPARTMENT_OTHER): Payer: Self-pay | Admitting: *Deleted

## 2019-09-15 DIAGNOSIS — R1115 Cyclical vomiting syndrome unrelated to migraine: Secondary | ICD-10-CM | POA: Insufficient documentation

## 2019-09-15 DIAGNOSIS — Z79899 Other long term (current) drug therapy: Secondary | ICD-10-CM | POA: Insufficient documentation

## 2019-09-15 DIAGNOSIS — E860 Dehydration: Secondary | ICD-10-CM | POA: Insufficient documentation

## 2019-09-15 LAB — COMPREHENSIVE METABOLIC PANEL
ALT: 13 U/L (ref 0–44)
AST: 19 U/L (ref 15–41)
Albumin: 4.9 g/dL (ref 3.5–5.0)
Alkaline Phosphatase: 88 U/L (ref 38–126)
Anion gap: 15 (ref 5–15)
BUN: 19 mg/dL (ref 6–20)
CO2: 25 mmol/L (ref 22–32)
Calcium: 9.7 mg/dL (ref 8.9–10.3)
Chloride: 89 mmol/L — ABNORMAL LOW (ref 98–111)
Creatinine, Ser: 1.25 mg/dL — ABNORMAL HIGH (ref 0.61–1.24)
GFR calc Af Amer: >60 mL/min (ref >60)
GFR calc non Af Amer: >60 mL/min (ref >60)
Glucose, Bld: 183 mg/dL — ABNORMAL HIGH (ref 70–99)
Potassium: 3.3 mmol/L — ABNORMAL LOW (ref 3.5–5.1)
Sodium: 129 mmol/L — ABNORMAL LOW (ref 135–145)
Total Bilirubin: 1.4 mg/dL — ABNORMAL HIGH (ref 0.3–1.2)
Total Protein: 8.5 g/dL — ABNORMAL HIGH (ref 6.5–8.1)

## 2019-09-15 LAB — CBC
HCT: 47.3 % (ref 39.0–52.0)
Hemoglobin: 15.5 g/dL (ref 13.0–17.0)
MCH: 23 pg — ABNORMAL LOW (ref 26.0–34.0)
MCHC: 32.8 g/dL (ref 30.0–36.0)
MCV: 70.2 fL — ABNORMAL LOW (ref 80.0–100.0)
Platelets: 350 10*3/uL (ref 150–400)
RBC: 6.74 MIL/uL — ABNORMAL HIGH (ref 4.22–5.81)
RDW: 14.9 % (ref 11.5–15.5)
WBC: 7.7 10*3/uL (ref 4.0–10.5)
nRBC: 0 % (ref 0.0–0.2)

## 2019-09-15 LAB — RAPID URINE DRUG SCREEN, HOSP PERFORMED
Amphetamines: NOT DETECTED
Barbiturates: NOT DETECTED
Benzodiazepines: NOT DETECTED
Cocaine: NOT DETECTED
Opiates: NOT DETECTED
Tetrahydrocannabinol: POSITIVE — AB

## 2019-09-15 LAB — URINALYSIS, ROUTINE W REFLEX MICROSCOPIC
Glucose, UA: NEGATIVE mg/dL
Hgb urine dipstick: NEGATIVE
Ketones, ur: 15 mg/dL — AB
Leukocytes,Ua: NEGATIVE
Nitrite: NEGATIVE
Protein, ur: >300 mg/dL — AB
Specific Gravity, Urine: >1.03 — ABNORMAL HIGH (ref 1.005–1.030)
pH: 6 (ref 5.0–8.0)

## 2019-09-15 LAB — URINALYSIS, MICROSCOPIC (REFLEX)

## 2019-09-15 LAB — LIPASE, BLOOD: Lipase: 26 U/L (ref 11–51)

## 2019-09-15 MED ORDER — DROPERIDOL 2.5 MG/ML IJ SOLN
1.2500 mg | Freq: Once | INTRAMUSCULAR | Status: AC
  Administered 2019-09-15: 1.25 mg via INTRAVENOUS
  Filled 2019-09-15: qty 2

## 2019-09-15 MED ORDER — IOHEXOL 300 MG/ML  SOLN
100.0000 mL | Freq: Once | INTRAMUSCULAR | Status: AC
  Administered 2019-09-15: 100 mL via INTRAVENOUS

## 2019-09-15 MED ORDER — PROMETHAZINE HCL 25 MG RE SUPP
25.0000 mg | Freq: Four times a day (QID) | RECTAL | 0 refills | Status: DC | PRN
Start: 2019-09-15 — End: 2020-04-04

## 2019-09-15 MED ORDER — SODIUM CHLORIDE 0.9 % IV BOLUS
1000.0000 mL | Freq: Once | INTRAVENOUS | Status: AC
  Administered 2019-09-15: 1000 mL via INTRAVENOUS

## 2019-09-15 NOTE — ED Triage Notes (Signed)
Vomiting for a week 

## 2019-09-15 NOTE — ED Provider Notes (Signed)
[facility] DEPART[facility] Provider Note   CSN: [id number] Arrival date & time: 09/15/19  1700     History Chief Complaint  Patient presents with  . Emesis    Sean Benton is a 24 y.o. male.  Pt presents to the ED today with n/v.  Pt has had n/v for a week.  The pt was seen here on 5/19 for the same.  He said he's not been able to keep down any fluids.  He has diffuse abd pain.  No f/c.         History reviewed. No pertinent past medical history.  There are no problems to display for this patient.   History reviewed. No pertinent surgical history.     No family history on file.  Social History   Tobacco Use  . Smoking status: Never Smoker  . Smokeless tobacco: Never Used  Substance Use Topics  . Alcohol use: Not Currently  . Drug use: Yes    Types: Marijuana    Home Medications Prior to Admission medications   Medication Sig Start Date End Date Taking? Authorizing Provider  famotidine (PEPCID) 20 MG tablet Take 1 tablet (20 mg total) by mouth 2 (two) times daily as needed for heartburn or indigestion. 07/15/18   Petrucelli, Samantha R, PA-C  ondansetron (ZOFRAN ODT) 4 MG disintegrating tablet Take 1 tablet (4 mg total) by mouth every 8 (eight) hours as needed for nausea or vomiting. 11/11/18   Long, Wonda Olds, MD  ondansetron (ZOFRAN) 4 MG tablet Take 1 tablet (4 mg total) by mouth every 8 (eight) hours as needed for nausea or vomiting. 09/07/19   Hayden Rasmussen, MD  promethazine (PHENERGAN) 25 MG suppository Place 1 suppository (25 mg total) rectally every 6 (six) hours as needed for nausea or vomiting. 09/15/19   Isla Pence, MD    Allergies    Patient has no known allergies.  Review of Systems   Review of Systems  Gastrointestinal: Positive for abdominal pain, nausea and vomiting.  All other systems reviewed and are negative.   Physical Exam Updated Vital Signs BP 125/80 (BP Location: Right Arm)   Pulse 81   Temp 98.1 F (36.7  C) (Oral)   Resp 20   Ht 5\' 9"  (1.753 m)   Wt 71.2 kg   SpO2 100%   BMI 23.18 kg/m   Physical Exam Vitals and nursing note reviewed.  Constitutional:      Appearance: Normal appearance.  HENT:     Head: Normocephalic and atraumatic.     Right Ear: External ear normal.     Left Ear: External ear normal.     Nose: Nose normal.     Mouth/Throat:     Mouth: Mucous membranes are dry.  Eyes:     Extraocular Movements: Extraocular movements intact.     Conjunctiva/sclera: Conjunctivae normal.     Pupils: Pupils are equal, round, and reactive to light.  Cardiovascular:     Rate and Rhythm: Normal rate and regular rhythm.     Pulses: Normal pulses.     Heart sounds: Normal heart sounds.  Pulmonary:     Effort: Pulmonary effort is normal.     Breath sounds: Normal breath sounds.  Abdominal:     General: Abdomen is flat. Bowel sounds are normal.     Palpations: Abdomen is soft.     Tenderness: There is abdominal tenderness in the epigastric area.  Musculoskeletal:        General: Normal  range of motion.     Cervical back: Normal range of motion and neck supple.  Skin:    General: Skin is warm.     Capillary Refill: Capillary refill takes less than 2 seconds.  Neurological:     General: No focal deficit present.     Mental Status: He is alert and oriented to person, place, and time.  Psychiatric:        Mood and Affect: Mood normal.        Behavior: Behavior normal.        Thought Content: Thought content normal.        Judgment: Judgment normal.     ED Results / Procedures / Treatments   Labs (all labs ordered are listed, but only abnormal results are displayed) Labs Reviewed  COMPREHENSIVE METABOLIC PANEL - Abnormal; Notable for the following components:      Result Value   Sodium 129 (*)    Potassium 3.3 (*)    Chloride 89 (*)    Glucose, Bld 183 (*)    Creatinine, Ser 1.25 (*)    Total Protein 8.5 (*)    Total Bilirubin 1.4 (*)    All other components within  normal limits  CBC - Abnormal; Notable for the following components:   RBC 6.74 (*)    MCV 70.2 (*)    MCH 23.0 (*)    All other components within normal limits  URINALYSIS, ROUTINE W REFLEX MICROSCOPIC - Abnormal; Notable for the following components:   Specific Gravity, Urine >1.030 (*)    Bilirubin Urine SMALL (*)    Ketones, ur 15 (*)    Protein, ur >300 (*)    All other components within normal limits  URINALYSIS, MICROSCOPIC (REFLEX) - Abnormal; Notable for the following components:   Bacteria, UA RARE (*)    All other components within normal limits  RAPID URINE DRUG SCREEN, HOSP PERFORMED - Abnormal; Notable for the following components:   Tetrahydrocannabinol POSITIVE (*)    All other components within normal limits  LIPASE, BLOOD    EKG None  Radiology CT ABDOMEN PELVIS W CONTRAST  Result Date: 09/15/2019 CLINICAL DATA:  Vomiting for 1 week.  Abdominal pain. EXAM: CT ABDOMEN AND PELVIS WITH CONTRAST TECHNIQUE: Multidetector CT imaging of the abdomen and pelvis was performed using the standard protocol following bolus administration of intravenous contrast. CONTRAST:  OMNIPAQUE IOHEXOL 300 MG/ML  SOLN COMPARISON:  None. FINDINGS: Lower chest: Clear lung bases. Normal heart size without pericardial or pleural effusion. Hepatobiliary: Normal liver. Normal gallbladder, without biliary ductal dilatation. Pancreas: Normal, without mass or ductal dilatation. Spleen: Normal in size, without focal abnormality. Adrenals/Urinary Tract: Normal adrenal glands. Normal adrenal glands. Normal kidneys, without hydronephrosis. Normal urinary bladder. Stomach/Bowel: Normal stomach, without wall thickening. Normal colon and terminal ileum. Normal appendix, including on 55/2. Normal small bowel. Vascular/Lymphatic: Normal caliber of the aorta and branch vessels. No abdominopelvic adenopathy. Reproductive: Normal prostate. Other: No significant free fluid. Musculoskeletal: Transitional S1  vertebral body. IMPRESSION: No acute process or explanation for abdominal pain/vomiting. Electronically Signed   By: Jeronimo Greaves M.D.   On: 09/15/2019 19:16    Procedures Procedures (including critical care time)  Medications Ordered in ED Medications  sodium chloride 0.9 % bolus 1,000 mL (0 mLs Intravenous Stopped 09/15/19 2017)  droperidol (INAPSINE) 2.5 MG/ML injection 1.25 mg (1.25 mg Intravenous Given 09/15/19 1858)  iohexol (OMNIPAQUE) 300 MG/ML solution 100 mL (100 mLs Intravenous Contrast Given 09/15/19 1844)    ED  Course  I have reviewed the triage vital signs and the nursing notes.  Pertinent labs & imaging results that were available during my care of the patient were reviewed by me and considered in my medical decision making (see chart for details).    MDM Rules/Calculators/A&P                      Pt uses [facility] frequently and has been here several times for n/v.  I suspect he has cyclic vomiting syndrome.  He is encouraged strongly to stop using MJ.  CT with nothing acute.  Elevation in BS likely reactive.  He is not in dka.  Pt is stable for d/c.  Return if worse.  Final Clinical Impression(s) / ED Diagnoses Final diagnoses:  Cyclic vomiting syndrome  Dehydration    Rx / DC Orders ED Discharge Orders         Ordered    promethazine (PHENERGAN) 25 MG suppository  Every 6 hours PRN     09/15/19 2113           Jacalyn Lefevre, MD 09/15/19 2114

## 2019-09-15 NOTE — Discharge Instructions (Signed)
Do not use Marijuana.

## 2019-12-03 ENCOUNTER — Emergency Department (HOSPITAL_BASED_OUTPATIENT_CLINIC_OR_DEPARTMENT_OTHER)
Admission: EM | Admit: 2019-12-03 | Discharge: 2019-12-03 | Disposition: A | Discharge status: Home / Self Care | Payer: Medicaid Other | Attending: Emergency Medicine | Admitting: Emergency Medicine

## 2019-12-03 ENCOUNTER — Other Ambulatory Visit: Payer: Self-pay

## 2019-12-03 ENCOUNTER — Encounter (HOSPITAL_BASED_OUTPATIENT_CLINIC_OR_DEPARTMENT_OTHER): Payer: Self-pay | Admitting: Emergency Medicine

## 2019-12-03 DIAGNOSIS — R112 Nausea with vomiting, unspecified: Secondary | ICD-10-CM

## 2019-12-03 DIAGNOSIS — R1084 Generalized abdominal pain: Secondary | ICD-10-CM | POA: Insufficient documentation

## 2019-12-03 HISTORY — DX: Cyclical vomiting syndrome unrelated to migraine: R11.15

## 2019-12-03 LAB — COMPREHENSIVE METABOLIC PANEL
ALT: 16 U/L (ref 0–44)
AST: 23 U/L (ref 15–41)
Albumin: 5 g/dL (ref 3.5–5.0)
Alkaline Phosphatase: 91 U/L (ref 38–126)
Anion gap: 12 (ref 5–15)
BUN: 20 mg/dL (ref 6–20)
CO2: 27 mmol/L (ref 22–32)
Calcium: 10 mg/dL (ref 8.9–10.3)
Chloride: 94 mmol/L — ABNORMAL LOW (ref 98–111)
Creatinine, Ser: 1.23 mg/dL (ref 0.61–1.24)
GFR calc Af Amer: >60 mL/min (ref >60)
GFR calc non Af Amer: >60 mL/min (ref >60)
Glucose, Bld: 115 mg/dL — ABNORMAL HIGH (ref 70–99)
Potassium: 3.3 mmol/L — ABNORMAL LOW (ref 3.5–5.1)
Sodium: 133 mmol/L — ABNORMAL LOW (ref 135–145)
Total Bilirubin: 1.2 mg/dL (ref 0.3–1.2)
Total Protein: 8.9 g/dL — ABNORMAL HIGH (ref 6.5–8.1)

## 2019-12-03 LAB — URINALYSIS, ROUTINE W REFLEX MICROSCOPIC
Bilirubin Urine: NEGATIVE
Glucose, UA: NEGATIVE mg/dL
Hgb urine dipstick: NEGATIVE
Ketones, ur: NEGATIVE mg/dL
Leukocytes,Ua: NEGATIVE
Nitrite: NEGATIVE
Protein, ur: >300 mg/dL — AB
Specific Gravity, Urine: >1.03 — ABNORMAL HIGH (ref 1.005–1.030)
pH: 6 (ref 5.0–8.0)

## 2019-12-03 LAB — URINALYSIS, MICROSCOPIC (REFLEX)

## 2019-12-03 LAB — CBC
HCT: 48.5 % (ref 39.0–52.0)
Hemoglobin: 15.3 g/dL (ref 13.0–17.0)
MCH: 22.7 pg — ABNORMAL LOW (ref 26.0–34.0)
MCHC: 31.5 g/dL (ref 30.0–36.0)
MCV: 72.1 fL — ABNORMAL LOW (ref 80.0–100.0)
Platelets: 340 10*3/uL (ref 150–400)
RBC: 6.73 MIL/uL — ABNORMAL HIGH (ref 4.22–5.81)
RDW: 15.4 % (ref 11.5–15.5)
WBC: 7 10*3/uL (ref 4.0–10.5)
nRBC: 0 % (ref 0.0–0.2)

## 2019-12-03 LAB — RAPID URINE DRUG SCREEN, HOSP PERFORMED
Amphetamines: NOT DETECTED
Barbiturates: NOT DETECTED
Benzodiazepines: NOT DETECTED
Cocaine: NOT DETECTED
Opiates: NOT DETECTED
Tetrahydrocannabinol: POSITIVE — AB

## 2019-12-03 LAB — LIPASE, BLOOD: Lipase: 25 U/L (ref 11–51)

## 2019-12-03 MED ORDER — ONDANSETRON 4 MG PO TBDP
4.0000 mg | ORAL_TABLET | Freq: Three times a day (TID) | ORAL | 0 refills | Status: DC | PRN
Start: 2019-12-03 — End: 2023-08-25

## 2019-12-03 MED ORDER — SODIUM CHLORIDE 0.9 % IV BOLUS
1000.0000 mL | Freq: Once | INTRAVENOUS | Status: AC
  Administered 2019-12-03: 1000 mL via INTRAVENOUS

## 2019-12-03 MED ORDER — HALOPERIDOL LACTATE 5 MG/ML IJ SOLN
2.0000 mg | Freq: Once | INTRAMUSCULAR | Status: AC
  Administered 2019-12-03: 2 mg via INTRAVENOUS
  Filled 2019-12-03: qty 1

## 2019-12-03 NOTE — ED Provider Notes (Signed)
[facility] EMERGENCY DEPARTMENT Provider Note   CSN: [id number] Arrival date & time: 12/03/19  0947     History Chief Complaint  Patient presents with  . Emesis    Sean Benton is a 24 y.o. male past medical history of cyclical vomiting who presents for evaluation of nausea/vomiting over the last 2 to 3 days.  He states that he has not been able to tolerate any p.o.  He reports multiple episodes of non- bilious vomiting.  He states that a couple episodes have had small streaks of blood but no gross hematemesis.  He states that he has not had any diarrhea.  He does not remember his last bowel movement was but states he still passing flatus.  He states that his abdominal pain has been all over.  He states it is like a cramping type pain.  He states he has not had any sick contacts.  He has not had any fevers.  He denies any chest pain, difficulty breathing, dysuria, hematuria.  He does not smoke any cigarettes and denies any alcohol use.  He does endorse using marijuana and states he last used yesterday.  The history is provided by the patient.       Past Medical History:  Diagnosis Date  . Cyclical vomiting     There are no problems to display for this patient.   History reviewed. No pertinent surgical history.     No family history on file.  Social History   Tobacco Use  . Smoking status: Never Smoker  . Smokeless tobacco: Never Used  Vaping Use  . Vaping Use: Never used  Substance Use Topics  . Alcohol use: Not Currently  . Drug use: Yes    Types: Marijuana    Home Medications Prior to Admission medications   Medication Sig Start Date End Date Taking? Authorizing Provider  famotidine (PEPCID) 20 MG tablet Take 1 tablet (20 mg total) by mouth 2 (two) times daily as needed for heartburn or indigestion. 07/15/18   Petrucelli, Samantha R, PA-C  ondansetron (ZOFRAN ODT) 4 MG disintegrating tablet Take 1 tablet (4 mg total) by mouth every 8 (eight) hours as  needed for nausea or vomiting. 12/03/19   Maxwell Caul, PA-C  ondansetron (ZOFRAN) 4 MG tablet Take 1 tablet (4 mg total) by mouth every 8 (eight) hours as needed for nausea or vomiting. 09/07/19   Terrilee Files, MD  promethazine (PHENERGAN) 25 MG suppository Place 1 suppository (25 mg total) rectally every 6 (six) hours as needed for nausea or vomiting. 09/15/19   Jacalyn Lefevre, MD    Allergies    Patient has no known allergies.  Review of Systems   Review of Systems  Constitutional: Negative for fever.  Respiratory: Negative for cough and shortness of breath.   Cardiovascular: Negative for chest pain.  Gastrointestinal: Positive for abdominal pain, nausea and vomiting.  Genitourinary: Negative for dysuria and hematuria.  Neurological: Negative for headaches.  All other systems reviewed and are negative.   Physical Exam Updated Vital Signs BP (!) 141/97   Pulse (!) 52   Temp 98.9 F (37.2 C) (Oral)   Resp 13   Ht 5\' 9"  (1.753 m)   Wt 79.4 kg   SpO2 100%   BMI 25.84 kg/m   Physical Exam Vitals and nursing note reviewed.  Constitutional:      Appearance: Normal appearance. He is well-developed.     Comments: Sitting comfortably on examination table  HENT:  Head: Normocephalic and atraumatic.  Eyes:     General: Lids are normal.     Conjunctiva/sclera: Conjunctivae normal.     Pupils: Pupils are equal, round, and reactive to light.  Cardiovascular:     Rate and Rhythm: Normal rate and regular rhythm.     Pulses: Normal pulses.     Heart sounds: Normal heart sounds. No murmur heard.  No friction rub. No gallop.   Pulmonary:     Effort: Pulmonary effort is normal.     Breath sounds: Normal breath sounds.     Comments: Lungs clear to auscultation bilaterally.  Symmetric chest rise.  No wheezing, rales, rhonchi. Abdominal:     Palpations: Abdomen is soft. Abdomen is not rigid.     Tenderness: There is generalized abdominal tenderness and tenderness in the  right lower quadrant and suprapubic area. There is no right CVA tenderness, left CVA tenderness or guarding.     Comments: Generalized abdominal tenderness noted.  No focal tenderness noted.  Point.  No CVA tenderness.  Abdomen is soft, nondistended.  No rigidity, guarding.  Musculoskeletal:        General: Normal range of motion.     Cervical back: Full passive range of motion without pain.  Skin:    General: Skin is warm and dry.     Capillary Refill: Capillary refill takes less than 2 seconds.  Neurological:     Mental Status: He is alert and oriented to person, place, and time.  Psychiatric:        Speech: Speech normal.     ED Results / Procedures / Treatments   Labs (all labs ordered are listed, but only abnormal results are displayed) Labs Reviewed  COMPREHENSIVE METABOLIC PANEL - Abnormal; Notable for the following components:      Result Value   Sodium 133 (*)    Potassium 3.3 (*)    Chloride 94 (*)    Glucose, Bld 115 (*)    Total Protein 8.9 (*)    All other components within normal limits  CBC - Abnormal; Notable for the following components:   RBC 6.73 (*)    MCV 72.1 (*)    MCH 22.7 (*)    All other components within normal limits  URINALYSIS, ROUTINE W REFLEX MICROSCOPIC - Abnormal; Notable for the following components:   Specific Gravity, Urine >1.030 (*)    Protein, ur >300 (*)    All other components within normal limits  RAPID URINE DRUG SCREEN, HOSP PERFORMED - Abnormal; Notable for the following components:   Tetrahydrocannabinol POSITIVE (*)    All other components within normal limits  URINALYSIS, MICROSCOPIC (REFLEX) - Abnormal; Notable for the following components:   Bacteria, UA RARE (*)    All other components within normal limits  LIPASE, BLOOD    EKG EKG Interpretation  Date/Time:  Saturday December 03 2019 13:46:52 EDT Ventricular Rate:  51 PR Interval:    QRS Duration: 92 QT Interval:  435 QTC Calculation: 401 R Axis:   82 Text  Interpretation: Sinus rhythm Short PR interval Right atrial enlargement ST elevation, consider anterolateral injury diffusse ST changes, suspect early repolarization. no old comparison Confirmed by Arby Barrette [phone]) on 12/03/2019 1:49:58 PM   Radiology No results found.  Procedures Procedures (including critical care time)  Medications Ordered in ED Medications  sodium chloride 0.9 % bolus 1,000 mL (0 mLs Intravenous Stopped 12/03/19 1450)  haloperidol lactate (HALDOL) injection 2 mg (2 mg Intravenous Given 12/03/19 1450)  ED Course  I have reviewed the triage vital signs and the nursing notes.  Pertinent labs & imaging results that were available during my care of the patient were reviewed by me and considered in my medical decision making (see chart for details).    MDM Rules/Calculators/A&P                          24 year old male who presents for evaluation of abdominal pain, nausea/vomiting over the last few days.  States that he has not had any fevers or diarrhea.  He endorses marijuana use yesterday.  On initially arrival, he is afebrile, nontoxic-appearing.  Vital signs are stable.  On exam, he has some generalized tenderness slightly worse in the lower abdomen, particularly the right diffusely.  No focal tenderness of McBurney's point.  No CVA tenderness.  Suspect that this is most likely cyclical vomiting versus cannabinoid hyperemesis syndrome.  Also consider viral infectious etiology.  History/physical exam not concerning for small bowel obstruction, diverticulitis.  Doubt appendicitis given history/physical exam.  Will obtain EKG for evaluation of QTC.  Will give Haldol.  CMP shows potassium of 3.3.  BUN of 20, creatinine 1.23.  Lipase normal.  CBC shows no leukocytosis or anemia.  UDS is positive for marijuana.  UA shows some proteinuria but otherwise no infectious etiology.  Reevaluation.  Patient reports improvement in pain after Haldol.  Patient reports improvement  in his symptoms.  His abdominal exam is improved.  He still has some mild tenderness diffusely but is much improved.  Patient was able to drink ginger ale without vomiting.  He states that it "made him unsettled."  Repeat abdominal exam shows he is improved from initial exam.  I did discuss with patient regarding further treatment here in the ED.  We discussed regarding CT on pelvis for evaluation of appendicitis.  My suspicion is very low as he is not focally tender in the right lower quadrant and has no leukocytosis or fever here in the ED after 2 to 3 days of symptoms.  I did offer him a CT scan.  We discussed at length.  We discussed risk first benefits of declining a CT scan, including worsening condition.  Patient opted to decline CT on pelvis.  He would rather watch his symptoms and return if he has any worsening symptoms.  I feel that this is reasonable. At this time, patient exhibits no emergent life-threatening condition that require further evaluation in ED or admission. Patient had ample opportunity for questions and discussion. All patient's questions were answered with full understanding. Strict return precautions discussed. Patient expresses understanding and agreement to plan.   Portions of this note were generated with Scientist, clinical (histocompatibility and immunogenetics). Dictation errors may occur despite best attempts at proofreading.   Final Clinical Impression(s) / ED Diagnoses Final diagnoses:  Generalized abdominal pain  Non-intractable vomiting with nausea, unspecified vomiting type    Rx / DC Orders ED Discharge Orders         Ordered    ondansetron (ZOFRAN ODT) 4 MG disintegrating tablet  Every 8 hours PRN     Discontinue  Reprint     12/03/19 1650           Rosana Hoes 12/03/19 1839    Arby Barrette, MD 12/21/19 1430

## 2019-12-03 NOTE — ED Notes (Signed)
Given ginger ale 

## 2019-12-03 NOTE — Discharge Instructions (Signed)
Take Zofran as needed for nausea/vomiting.  Make sure you are staying hydrated drink plenty of fluids.  Return the emergency department for any worsening pain, vomiting, fevers or any other worsening concerning symptoms.

## 2019-12-03 NOTE — ED Triage Notes (Signed)
N/V x 1 week. Hx of cyclical vomiting. States last marijuana use was 2 weeks ago

## 2020-04-04 ENCOUNTER — Emergency Department (HOSPITAL_BASED_OUTPATIENT_CLINIC_OR_DEPARTMENT_OTHER)
Admission: EM | Admit: 2020-04-04 | Discharge: 2020-04-04 | Disposition: A | Discharge status: Home / Self Care | Payer: BC Managed Care – PPO | Attending: Emergency Medicine | Admitting: Emergency Medicine

## 2020-04-04 ENCOUNTER — Encounter (HOSPITAL_BASED_OUTPATIENT_CLINIC_OR_DEPARTMENT_OTHER): Payer: Self-pay

## 2020-04-04 ENCOUNTER — Other Ambulatory Visit: Payer: Self-pay

## 2020-04-04 DIAGNOSIS — R1111 Vomiting without nausea: Secondary | ICD-10-CM

## 2020-04-04 DIAGNOSIS — R112 Nausea with vomiting, unspecified: Secondary | ICD-10-CM | POA: Diagnosis present

## 2020-04-04 LAB — COMPREHENSIVE METABOLIC PANEL
ALT: 15 U/L (ref 0–44)
AST: 20 U/L (ref 15–41)
Albumin: 5.3 g/dL — ABNORMAL HIGH (ref 3.5–5.0)
Alkaline Phosphatase: 83 U/L (ref 38–126)
Anion gap: 12 (ref 5–15)
BUN: 12 mg/dL (ref 6–20)
CO2: 26 mmol/L (ref 22–32)
Calcium: 9.9 mg/dL (ref 8.9–10.3)
Chloride: 102 mmol/L (ref 98–111)
Creatinine, Ser: 1.06 mg/dL (ref 0.61–1.24)
GFR, Estimated: >60 mL/min (ref >60)
Glucose, Bld: 134 mg/dL — ABNORMAL HIGH (ref 70–99)
Potassium: 3.5 mmol/L (ref 3.5–5.1)
Sodium: 140 mmol/L (ref 135–145)
Total Bilirubin: 1 mg/dL (ref 0.3–1.2)
Total Protein: 8.7 g/dL — ABNORMAL HIGH (ref 6.5–8.1)

## 2020-04-04 LAB — CBC WITH DIFFERENTIAL/PLATELET
Abs Immature Granulocytes: 0.02 10*3/uL (ref 0.00–0.07)
Basophils Absolute: 0 10*3/uL (ref 0.0–0.1)
Basophils Relative: 0 %
Eosinophils Absolute: 0 10*3/uL (ref 0.0–0.5)
Eosinophils Relative: 0 %
HCT: 42.7 % (ref 39.0–52.0)
Hemoglobin: 13.5 g/dL (ref 13.0–17.0)
Immature Granulocytes: 0 %
Lymphocytes Relative: 10 %
Lymphs Abs: 0.8 10*3/uL (ref 0.7–4.0)
MCH: 23.2 pg — ABNORMAL LOW (ref 26.0–34.0)
MCHC: 31.6 g/dL (ref 30.0–36.0)
MCV: 73.4 fL — ABNORMAL LOW (ref 80.0–100.0)
Monocytes Absolute: 0.3 10*3/uL (ref 0.1–1.0)
Monocytes Relative: 4 %
Neutro Abs: 6.4 10*3/uL (ref 1.7–7.7)
Neutrophils Relative %: 86 %
Platelets: 275 10*3/uL (ref 150–400)
RBC: 5.82 MIL/uL — ABNORMAL HIGH (ref 4.22–5.81)
RDW: 15 % (ref 11.5–15.5)
WBC: 7.5 10*3/uL (ref 4.0–10.5)
nRBC: 0 % (ref 0.0–0.2)

## 2020-04-04 LAB — LIPASE, BLOOD: Lipase: 22 U/L (ref 11–51)

## 2020-04-04 MED ORDER — ONDANSETRON HCL 4 MG/2ML IJ SOLN
4.0000 mg | Freq: Once | INTRAMUSCULAR | Status: AC
  Administered 2020-04-04: 11:00:00 4 mg via INTRAVENOUS
  Filled 2020-04-04: qty 2

## 2020-04-04 MED ORDER — PROMETHAZINE HCL 25 MG PO TABS
25.0000 mg | ORAL_TABLET | Freq: Three times a day (TID) | ORAL | 0 refills | Status: DC | PRN
Start: 2020-04-04 — End: 2023-08-25

## 2020-04-04 MED ORDER — HALOPERIDOL LACTATE 5 MG/ML IJ SOLN
5.0000 mg | Freq: Once | INTRAMUSCULAR | Status: AC
  Administered 2020-04-04: 10:00:00 5 mg via INTRAVENOUS
  Filled 2020-04-04: qty 1

## 2020-04-04 MED ORDER — ONDANSETRON HCL 4 MG PO TABS: 4.0000 mg | ORAL_TABLET | Freq: Three times a day (TID) | ORAL | 0 refills | Status: DC | PRN

## 2020-04-04 MED ORDER — FAMOTIDINE 20 MG PO TABS
20.0000 mg | ORAL_TABLET | Freq: Two times a day (BID) | ORAL | 0 refills | Status: DC | PRN
Start: 2020-04-04 — End: 2020-04-04

## 2020-04-04 MED ORDER — PROMETHAZINE HCL 25 MG RE SUPP
25.0000 mg | Freq: Four times a day (QID) | RECTAL | 0 refills | Status: DC | PRN
Start: 2020-04-04 — End: 2020-04-04

## 2020-04-04 MED ORDER — SODIUM CHLORIDE 0.9 % IV BOLUS
1000.0000 mL | Freq: Once | INTRAVENOUS | Status: AC
  Administered 2020-04-04: 10:00:00 1000 mL via INTRAVENOUS

## 2020-04-04 MED FILL — PROMETHAZINE 25 MG TABLET: 25 | 6 days supply | Qty: 20 | Fill #0

## 2020-04-04 MED FILL — FAMOTIDINE 20 MG TABS: 20 | 5 days supply | Qty: 10 | Fill #0

## 2020-04-04 MED FILL — PROMETHAZINE HCL 25 MG SUPP: 25 | 6 days supply | Qty: 12 | Fill #0

## 2020-04-04 NOTE — ED Provider Notes (Signed)
[facility] EMERGENCY DEPARTMENT Provider Note   CSN: [id number] Arrival date & time: 04/04/20  0935     History Chief Complaint  Patient presents with  . Emesis    Sean Benton is a 24 y.o. male.  The history is provided by the patient.  Emesis Severity:  Mild Timing:  Intermittent Progression:  Unchanged Chronicity:  Recurrent Context comment:  Marijauan use Relieved by:  Nothing Worsened by:  Nothing Ineffective treatments:  None tried Associated symptoms: no abdominal pain, no arthralgias, no chills, no cough, no diarrhea, no fever, no headaches, no myalgias, no sore throat and no URI   Risk factors: no sick contacts        Past Medical History:  Diagnosis Date  . Cyclical vomiting     There are no problems to display for this patient.   History reviewed. No pertinent surgical history.     History reviewed. No pertinent family history.  Social History   Tobacco Use  . Smoking status: Never Smoker  . Smokeless tobacco: Never Used  Vaping Use  . Vaping Use: Never used  Substance Use Topics  . Alcohol use: Not Currently  . Drug use: Yes    Types: Marijuana    Home Medications Prior to Admission medications   Medication Sig Start Date End Date Taking? Authorizing Provider  ondansetron (ZOFRAN ODT) 4 MG disintegrating tablet Take 1 tablet (4 mg total) by mouth every 8 (eight) hours as needed for nausea or vomiting. 12/03/19   Maxwell Caul, PA-C  promethazine (PHENERGAN) 25 MG tablet Take 1 tablet (25 mg total) by mouth every 8 (eight) hours as needed for up to 20 days for nausea or vomiting. 04/04/20 04/24/20  Brinlyn Cena, DO  famotidine (PEPCID) 20 MG tablet Take 1 tablet (20 mg total) by mouth 2 (two) times daily as needed for heartburn or indigestion. 04/04/20 04/04/20  Virgina Norfolk, DO    Allergies    Patient has no known allergies.  Review of Systems   Review of Systems  Constitutional: Negative for chills and fever.   HENT: Negative for ear pain and sore throat.   Eyes: Negative for pain and visual disturbance.  Respiratory: Negative for cough and shortness of breath.   Cardiovascular: Negative for chest pain and palpitations.  Gastrointestinal: Positive for nausea and vomiting. Negative for abdominal pain and diarrhea.  Genitourinary: Negative for dysuria and hematuria.  Musculoskeletal: Negative for arthralgias, back pain and myalgias.  Skin: Negative for color change and rash.  Neurological: Negative for seizures, syncope and headaches.  All other systems reviewed and are negative.   Physical Exam Updated Vital Signs  ED Triage Vitals  Enc Vitals Group     BP 04/04/20 0942 (!) 146/83     Pulse Rate 04/04/20 0942 (!) 50     Resp 04/04/20 0942 18     Temp 04/04/20 0942 99.5 F (37.5 C)     Temp Source 04/04/20 0942 Oral     SpO2 04/04/20 0942 100 %     Weight 04/04/20 0943 175 lb (79.4 kg)     Height 04/04/20 0943 5\' 9"  (1.753 m)     Head Circumference --      Peak Flow --      Pain Score 04/04/20 0943 9     Pain Loc --      Pain Edu? --      Excl. in GC? --     Physical Exam Vitals and nursing note reviewed.  Constitutional:      General: He is not in acute distress.    Appearance: He is well-developed and well-nourished. He is not ill-appearing.  HENT:     Head: Normocephalic and atraumatic.     Nose: Nose normal.     Mouth/Throat:     Mouth: Mucous membranes are moist.  Eyes:     Extraocular Movements: Extraocular movements intact.     Conjunctiva/sclera: Conjunctivae normal.     Pupils: Pupils are equal, round, and reactive to light.  Cardiovascular:     Rate and Rhythm: Normal rate and regular rhythm.     Pulses: Normal pulses.     Heart sounds: Normal heart sounds. No murmur heard.   Pulmonary:     Effort: Pulmonary effort is normal. No respiratory distress.     Breath sounds: Normal breath sounds.  Abdominal:     General: Abdomen is flat. There is no distension.      Palpations: Abdomen is soft. There is no mass.     Tenderness: There is no abdominal tenderness. There is no guarding or rebound.     Hernia: No hernia is present.  Musculoskeletal:        General: No edema.     Cervical back: Neck supple.  Skin:    General: Skin is warm and dry.     Capillary Refill: Capillary refill takes less than 2 seconds.  Neurological:     General: No focal deficit present.     Mental Status: He is alert.  Psychiatric:        Mood and Affect: Mood and affect normal.     ED Results / Procedures / Treatments   Labs (all labs ordered are listed, but only abnormal results are displayed) Labs Reviewed  CBC WITH DIFFERENTIAL/PLATELET - Abnormal; Notable for the following components:      Result Value   RBC 5.82 (*)    MCV 73.4 (*)    MCH 23.2 (*)    All other components within normal limits  COMPREHENSIVE METABOLIC PANEL - Abnormal; Notable for the following components:   Glucose, Bld 134 (*)    Total Protein 8.7 (*)    Albumin 5.3 (*)    All other components within normal limits  LIPASE, BLOOD    EKG None  Radiology No results found.  Procedures Procedures (including critical care time)  Medications Ordered in ED Medications  ondansetron (ZOFRAN) injection 4 mg (has no administration in time range)  sodium chloride 0.9 % bolus 1,000 mL (1,000 mLs Intravenous New Bag/Given 04/04/20 1005)  haloperidol lactate (HALDOL) injection 5 mg (5 mg Intravenous Given 04/04/20 1005)    ED Course  I have reviewed the triage vital signs and the nursing notes.  Pertinent labs & imaging results that were available during my care of the patient were reviewed by me and considered in my medical decision making (see chart for details).    MDM Rules/Calculators/A&P                          Sean Benton is a 24 year old male with history of hyperemesis presumed to be from marijuana who presents to the ED with nausea and vomiting.  Unremarkable vitals.  No  fever.  Symptoms for the last 2 days.  Admits to smoking marijuana.  Has no focal abdominal tenderness on exam.  Low suspicion for appendicitis or other intra-abdominal process at this time.  Mostly only having abdominal pain when he  throws up.  We will get basic labs to focus on symptomatic management.  Will give fluid bolus and IV Haldol and reevaluate.  Will evaluate for cholecystitis with labs, will check lipase to evaluate for pancreatitis.  Lab work overall showed no significant anemia, electrolyte abnormality, kidney injury.  Lipase normal doubt pancreatitis.  Gallbladder and liver enzymes within normal limits doubt cholecystitis.  Patient feeling better after IV fluids and antiemetics.  Overall suspect hyperemesis from marijuana.  Educated about marijuana use.  Will prescribe Phenergan.  Understands return precautions.  Discharged in ED in good condition.  This chart was dictated using voice recognition software.  Despite best efforts to proofread,  errors can occur which can change the documentation meaning.     Final Clinical Impression(s) / ED Diagnoses Final diagnoses:  Vomiting without nausea, intractability of vomiting not specified, unspecified vomiting type    Rx / DC Orders ED Discharge Orders         Ordered    ondansetron (ZOFRAN) 4 MG tablet  Every 8 hours PRN,   Status:  Discontinued        04/04/20 1054    promethazine (PHENERGAN) 25 MG suppository  Every 6 hours PRN,   Status:  Discontinued        04/04/20 1054    famotidine (PEPCID) 20 MG tablet  2 times daily PRN,   Status:  Discontinued        04/04/20 1054    ondansetron (ZOFRAN) 4 MG tablet  Every 8 hours PRN,   Status:  Discontinued        04/04/20 1056    promethazine (PHENERGAN) 25 MG tablet  Every 8 hours PRN        04/04/20 1058           Bence Trapp, DO 04/04/20 1101

## 2020-04-04 NOTE — ED Triage Notes (Addendum)
Pt states he has been vomiting since yesterday with generalized abdominal pain. Denies diarrhea/fever. Pt states he feels weak, came to ED with a cane that he doesn't normally use. Not vaccinated for flu or covid.   Also adds that 2 days ago he was leaving his friends house when he opened his eyes and was laying on the porch. Does not remember falling/losing consciousness. States was smoking marijuana at his friends.

## 2021-02-17 IMAGING — CT CT ABD-PELV W/ CM
2 of 4 series · 16 of 46 positions shown, 18 images · IV contrast (Omnipaque)
Comparison: None.

CLINICAL DATA: Vomiting for 1 week.  Abdominal pain.

EXAM:
CT ABDOMEN AND PELVIS WITH CONTRAST
TECHNIQUE: Multidetector CT imaging of the abdomen and pelvis was performed
using the standard protocol following bolus administration of
intravenous contrast.
CONTRAST:  100mL OMNIPAQUE IOHEXOL 300 MG/ML  SOLN

[Series 2: axial st · axial · 0.63mm/px · z∈[-541,-141]mm · 13 of 88 slices shown, 15 images]
[im 4/88  soft-tissue]
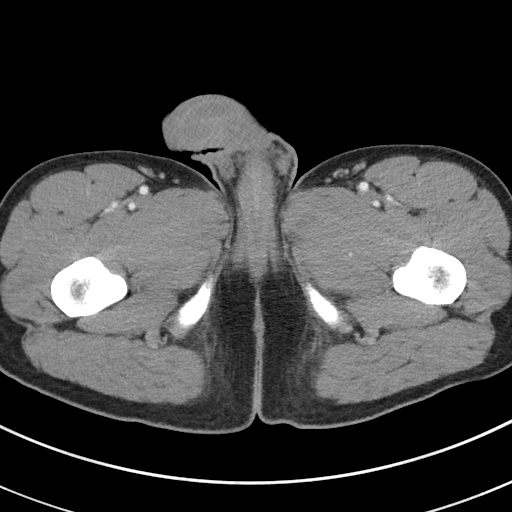
[im 4/88  bone]
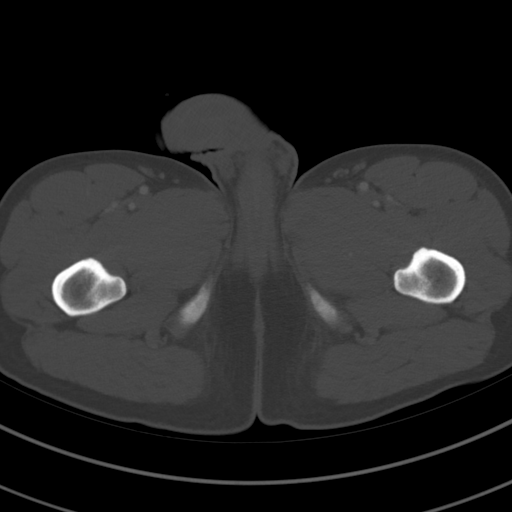
[im 12/88  soft-tissue]
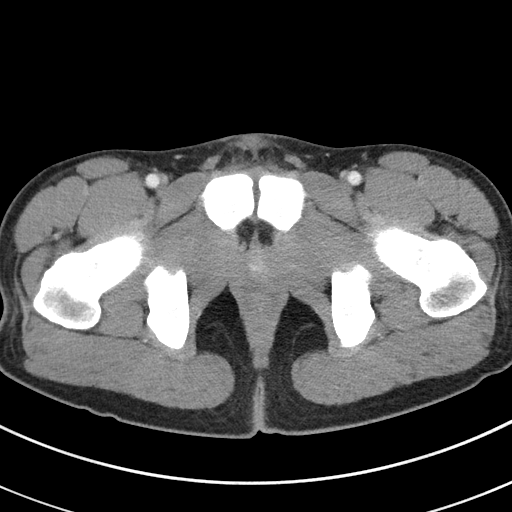
[im 19/88  soft-tissue]
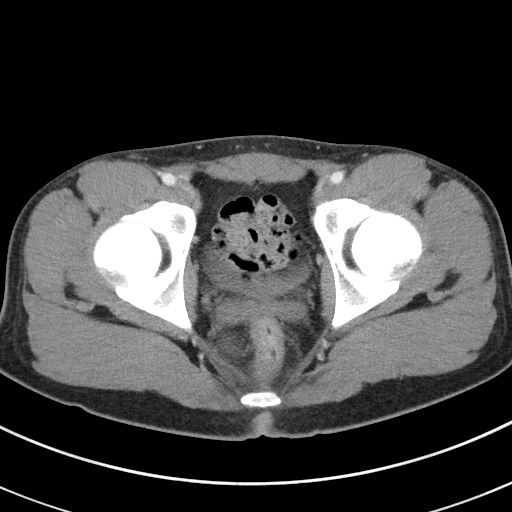
[im 23/88  soft-tissue]
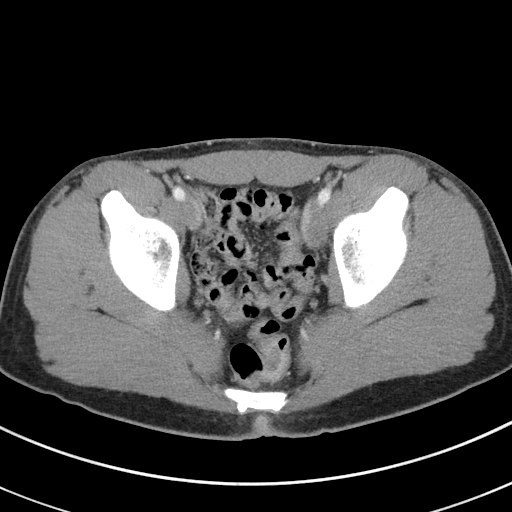
[im 31/88  soft-tissue]
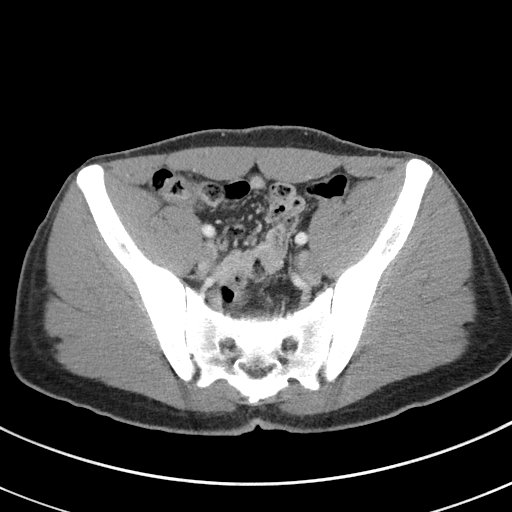
[im 38/88  soft-tissue]
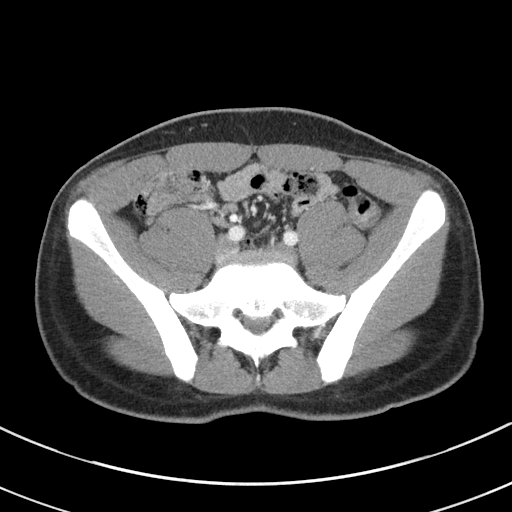
[im 46/88  soft-tissue]
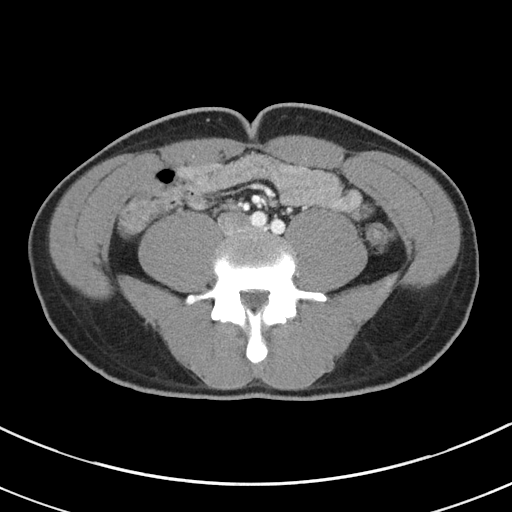
[im 50/88  soft-tissue]
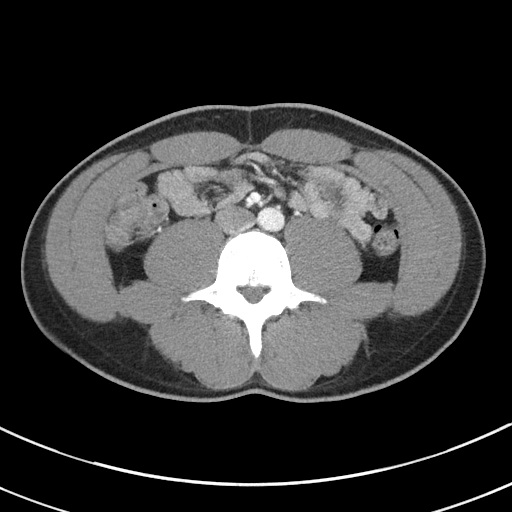
[im 57/88  soft-tissue]
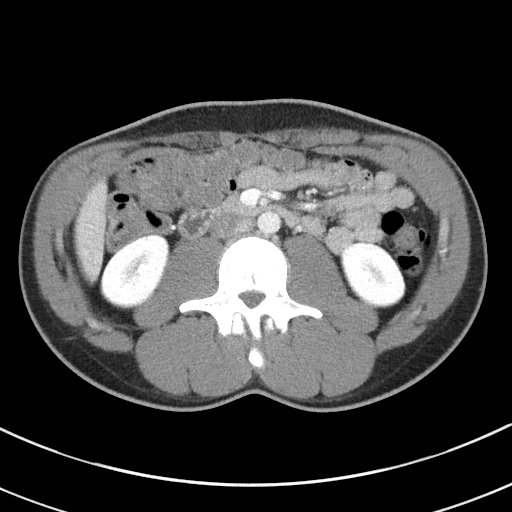
[im 57/88  bone]
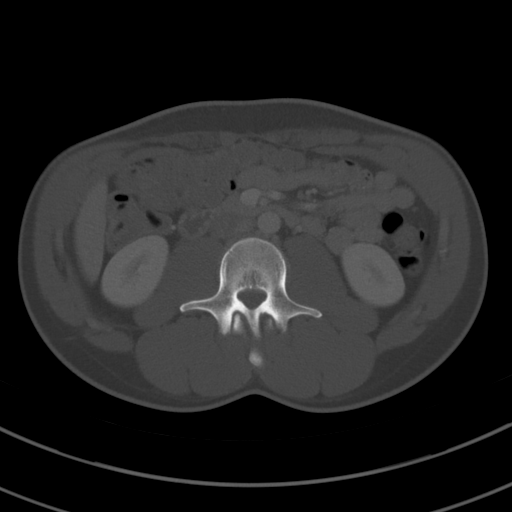
[im 65/88  soft-tissue]
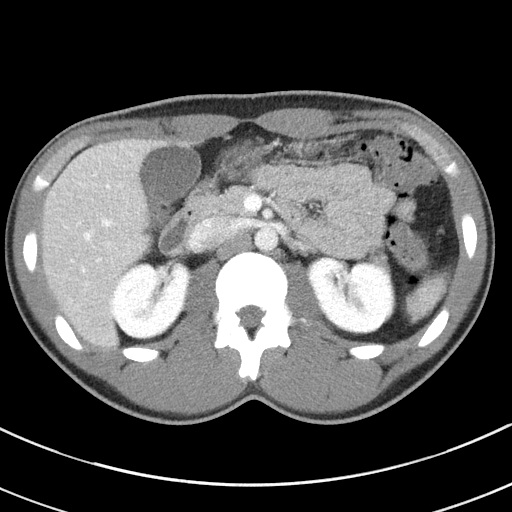
[im 69/88  soft-tissue]
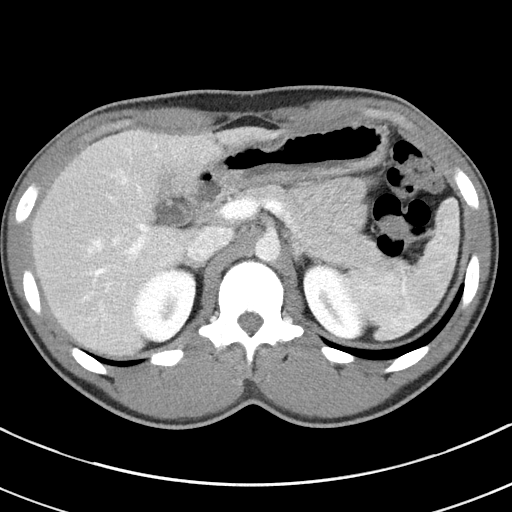
[im 76/88  soft-tissue]
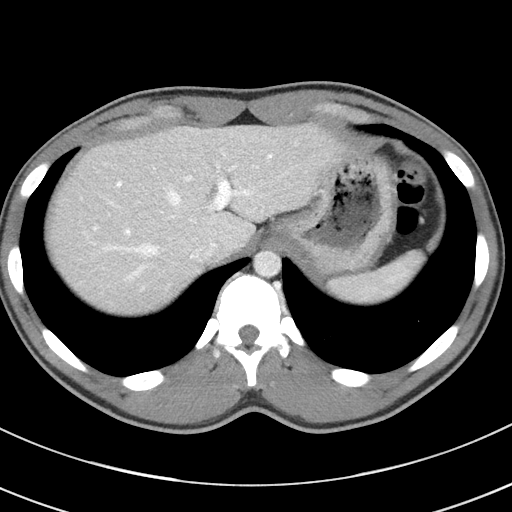
[im 84/88  soft-tissue]
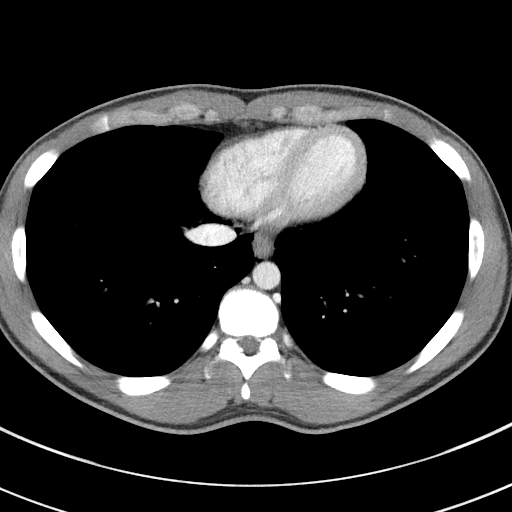

[Series 5: coronal st · coronal · 0.68mm/px · 3 of 79 slices shown]
[im 27/79  soft-tissue]
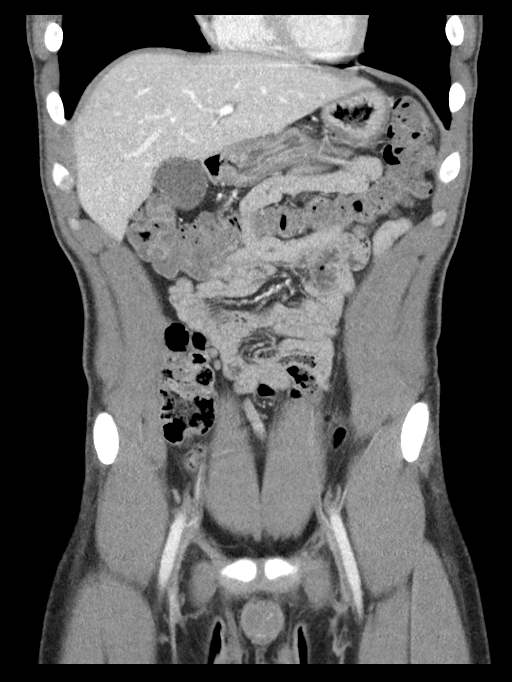
[im 35/79  soft-tissue]
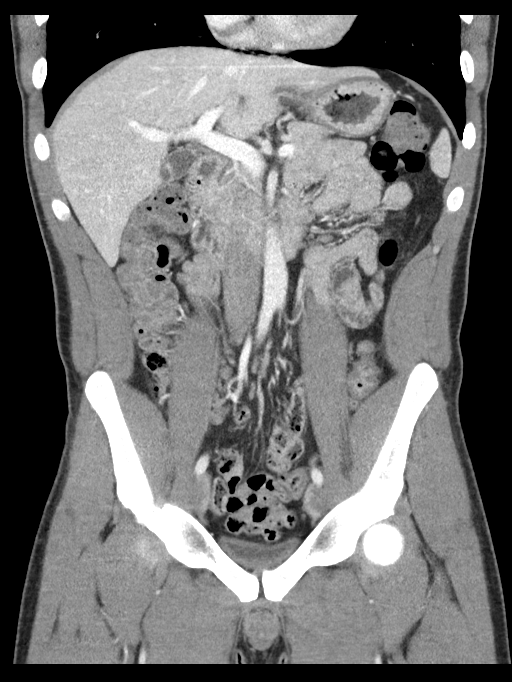
[im 44/79  soft-tissue]
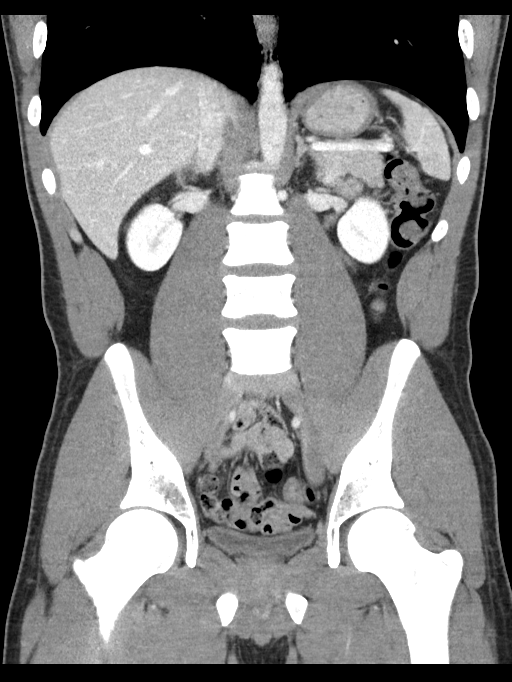

[16 of 46 positions shown; findings below may reference images not displayed]

FINDINGS: Lower chest: Clear lung bases. Normal heart size without pericardial
or pleural effusion.

Hepatobiliary: Normal liver. Normal gallbladder, without biliary
ductal dilatation.

Pancreas: Normal, without mass or ductal dilatation.

Spleen: Normal in size, without focal abnormality.

Adrenals/Urinary Tract: Normal adrenal glands. Normal adrenal
glands. Normal kidneys, without hydronephrosis. Normal urinary
bladder.

Stomach/Bowel: Normal stomach, without wall thickening. Normal colon
and terminal ileum. Normal appendix, including on 55/2. Normal small
bowel.

Vascular/Lymphatic: Normal caliber of the aorta and branch vessels.
No abdominopelvic adenopathy.

Reproductive: Normal prostate.

Other: No significant free fluid.

Musculoskeletal: Transitional S1 vertebral body.
IMPRESSION: No acute process or explanation for abdominal pain/vomiting.

## 2021-07-05 ENCOUNTER — Emergency Department (HOSPITAL_BASED_OUTPATIENT_CLINIC_OR_DEPARTMENT_OTHER)
Admission: EM | Admit: 2021-07-05 | Discharge: 2021-07-05 | Disposition: A | Discharge status: Home / Self Care | Payer: Self-pay | Attending: Emergency Medicine | Admitting: Emergency Medicine

## 2021-07-05 ENCOUNTER — Other Ambulatory Visit: Payer: Self-pay

## 2021-07-05 ENCOUNTER — Encounter (HOSPITAL_BASED_OUTPATIENT_CLINIC_OR_DEPARTMENT_OTHER): Payer: Self-pay | Admitting: *Deleted

## 2021-07-05 ENCOUNTER — Emergency Department (HOSPITAL_BASED_OUTPATIENT_CLINIC_OR_DEPARTMENT_OTHER): Payer: Self-pay

## 2021-07-05 DIAGNOSIS — S93402A Sprain of unspecified ligament of left ankle, initial encounter: Secondary | ICD-10-CM | POA: Insufficient documentation

## 2021-07-05 DIAGNOSIS — X509XXA Other and unspecified overexertion or strenuous movements or postures, initial encounter: Secondary | ICD-10-CM | POA: Insufficient documentation

## 2021-07-05 DIAGNOSIS — M25472 Effusion, left ankle: Secondary | ICD-10-CM

## 2021-07-05 NOTE — ED Provider Notes (Signed)
?[facility] EMERGENCY DEPARTMENT ?Provider Note ? ? ?CSN: [id number] ?Arrival date & time: 07/05/21  1830 ? ?  ? ?History ? ?Chief Complaint  ?Patient presents with  ? Ankle Pain  ? ? ?Sean Benton is a 26 y.o. male who presents to the ED today with complaint of gradual onset, constant, worsening, left ankle pain that began yesterday.  Patient reports every morning when he wakes up he typically will roll his ankles to pop his joints prior to standing up.  He states that he was doing this yesterday when he felt a sharp severe pain in his left ankle.  He did not think much of it however when he went to stand out of bed he noticed that he was limping.  He states he went to work for a couple hours however afterwards had worsening pain.  He states that he has had to use his mother's cane to help with ambulation.  Not taken anything for pain as he wanted to come to the ED to figure out what was wrong.  No other complaints at this time. ? ?The history is provided by the patient.  ? ?  ? ?Home Medications ?Prior to Admission medications   ?Medication Sig Start Date End Date Taking? Authorizing Provider  ?ondansetron (ZOFRAN ODT) 4 MG disintegrating tablet Take 1 tablet (4 mg total) by mouth every 8 (eight) hours as needed for nausea or vomiting. 12/03/19   Maxwell Caul, PA-C  ?promethazine (PHENERGAN) 25 MG tablet Take 1 tablet (25 mg total) by mouth every 8 (eight) hours as needed for up to 20 days for nausea or vomiting. 04/04/20 04/24/20  Virgina Norfolk, DO  ?famotidine (PEPCID) 20 MG tablet Take 1 tablet (20 mg total) by mouth 2 (two) times daily as needed for heartburn or indigestion. 04/04/20 04/04/20  Virgina Norfolk, DO  ?   ? ?Allergies    ?Patient has no known allergies.   ? ?Review of Systems   ?Review of Systems  ?Constitutional:  Negative for chills and fever.  ?Musculoskeletal:  Positive for arthralgias and joint swelling.  ?Skin:  Negative for color change and wound.  ?All other systems reviewed  and are negative. ? ?Physical Exam ?Updated Vital Signs ?BP 134/86 (BP Location: Right Arm)   Pulse (!) 48   Temp 98.8 ?F (37.1 ?C) (Oral)   Resp 14   Ht 5\' 9"  (1.753 m)   Wt 88.5 kg   SpO2 98%   BMI 28.80 kg/m?  ?Physical Exam ?Vitals and nursing note reviewed.  ?Constitutional:   ?   Appearance: He is not ill-appearing.  ?HENT:  ?   Head: Normocephalic and atraumatic.  ?Eyes:  ?   Conjunctiva/sclera: Conjunctivae normal.  ?Cardiovascular:  ?   Rate and Rhythm: Normal rate and regular rhythm.  ?Pulmonary:  ?   Effort: Pulmonary effort is normal.  ?   Breath sounds: Normal breath sounds.  ?Musculoskeletal:  ?   Comments: Mild swelling noted to left ankle with tenderness palpation along the medial malleolus.  Range of motion intact.  Patient able to dorsiflex and plantarflex without difficulty.  No overlying skin changes including erythema or increased warmth.  2+ DP pulse.  Cap refill less than 2 seconds to all toes.  ?Skin: ?   General: Skin is warm and dry.  ?   Coloration: Skin is not jaundiced.  ?Neurological:  ?   Mental Status: He is alert.  ? ? ?ED Results / Procedures / Treatments   ?Labs ?(  all labs ordered are listed, but only abnormal results are displayed) ?Labs Reviewed - No data to display ? ?EKG ?None ? ?Radiology ?DG Ankle Complete Left ? ?Result Date: 07/05/2021 ?CLINICAL DATA:  Medial and lateral ankle pain. No known injury. Rule out fracture. EXAM: LEFT ANKLE COMPLETE - 3+ VIEW COMPARISON:  None. FINDINGS: There is no evidence of fracture or dislocation. Ankle mortise is preserved. Intact talar dome. There is no evidence of arthropathy or other focal bone abnormality. There is a small ankle joint effusion. Soft tissues are unremarkable. IMPRESSION: Small ankle joint effusion. No osseous abnormality. Electronically Signed   By: Narda Rutherford M.D.   On: 07/05/2021 19:12   ? ?Procedures ?Procedures  ? ? ?Medications Ordered in ED ?Medications - No data to display ? ?ED Course/ Medical  Decision Making/ A&P ?  ?                        ?Medical Decision Making ?26 year old male who presents to the ED today with complaint of atraumatic left ankle pain again yesterday.  Has been having difficulty with ambulating since that time.  Level to the ED vitals are stable.  Patient is noted to be bradycardic with a heart rate of 48 however history of same.  X-ray was obtained prior to being seen of his left ankle which does show a small joint effusion.  No bony abnormalities.  On exam he is noted to have tenderness of patient along the medial malleolus of the left ankle.  He is neurovascular intact throughout.  No overlying skin changes including warmth or erythema.  Very low suspicion for septic arthritis.  Question ligamentous injury.  We will plan for ankle brace and crutches.  Patient instructed on RICE therapy and ibuprofen/Tylenol as needed for pain.  He is advised to follow-up with sports medicine as needed for further eval.  He is in agreement with plan and stable for discharge.  ? ?Problems Addressed: ?Effusion of left ankle: acute illness or injury ?Sprain of left ankle, unspecified ligament, initial encounter: acute illness or injury ? ?Amount and/or Complexity of Data Reviewed ?Radiology: ordered. Decision-making details documented in ED Course. ? ? ? ? ? ? ? ? ? ? ?Final Clinical Impression(s) / ED Diagnoses ?Final diagnoses:  ?Sprain of left ankle, unspecified ligament, initial encounter  ?Effusion of left ankle  ? ? ?Rx / DC Orders ?ED Discharge Orders   ? ? None  ? ?  ? ? ? ?Discharge Instructions   ? ?  ?Please wear ankle brace and use crutches as needed over the next week.  While at home please rest, ice, elevate your ankle to help reduce swelling/inflammation.  You can take ibuprofen and Tylenol as needed for pain.  ? ?Follow-up with Dr. Jordan Likes sports medicine for further evaluation. ? ?Return to the ED for any new/worsening symptoms. ? ? ? ? ?  ?Tanda Rockers, PA-C ?07/05/21 1937 ? ?   ?Franne Forts, DO ?07/05/21 2326 ? ?

## 2021-07-05 NOTE — ED Triage Notes (Signed)
Yesterday am before getting out of bed, pt was rotating his left ankle. Afterward it was painful to stand of apply weight on his ankle. He is ambulatory with a cane.  ?

## 2021-07-05 NOTE — Discharge Instructions (Signed)
Please wear ankle brace and use crutches as needed over the next week.  While at home please rest, ice, elevate your ankle to help reduce swelling/inflammation.  You can take ibuprofen and Tylenol as needed for pain.  ? ?Follow-up with Dr. Raeford Razor sports medicine for further evaluation. ? ?Return to the ED for any new/worsening symptoms. ?

## 2021-07-05 NOTE — ED Notes (Addendum)
Pt discharged to home. Discharge instructions have been discussed with patient and/or family members. Pt verbally acknowledges understanding d/c instructions, and endorses comprehension to checkout at registration before leaving.  °

## 2021-08-21 ENCOUNTER — Emergency Department (HOSPITAL_COMMUNITY)
Admission: EM | Admit: 2021-08-21 | Discharge: 2021-08-21 | Disposition: A | Discharge status: Home / Self Care | Payer: Self-pay | Attending: Emergency Medicine | Admitting: Emergency Medicine

## 2021-08-21 ENCOUNTER — Emergency Department (HOSPITAL_COMMUNITY): Payer: Self-pay

## 2021-08-21 ENCOUNTER — Encounter (HOSPITAL_COMMUNITY): Payer: Self-pay

## 2021-08-21 DIAGNOSIS — M79675 Pain in left toe(s): Secondary | ICD-10-CM | POA: Insufficient documentation

## 2021-08-21 MED ORDER — INDOMETHACIN 50 MG PO CAPS: 50.0000 mg | ORAL_CAPSULE | Freq: Two times a day (BID) | ORAL | 0 refills | Status: AC

## 2021-08-21 NOTE — ED Triage Notes (Signed)
Pt arrived via POV, c/o left foot pain and swelling since yesterday.  ?

## 2021-08-21 NOTE — Discharge Instructions (Signed)
As discussed, follow up with your doctor for recheck and evaluation of the toe and nail. If you do not have a doctor, there should be a referral number in your paperwork. ? ?Indomethacin as needed as directed for the toe pain. ?Weight bear as tolerated.  ?

## 2021-08-21 NOTE — ED Provider Notes (Signed)
?[place] DEPT ?Provider Note ? ? ?CSN: [id number] ?Arrival date & time: 08/21/21  0702 ? ?  ? ?History ? ?Chief Complaint  ?Patient presents with  ? Toe Pain  ? ? ?Sean Benton is a 26 y.o. male. ? ?26 year old male presents with complaint of pain in his left great toe, onset yesterday without injury.  Patient states that he did play basketball a few days ago but does not recall injuring his foot.  Reports swelling, bruising at the left first MTP.  No history of similar previously.  Patient is worse with bearing weight, has not taken anything for his pain.  No other complaints or concerns. ? ? ?  ? ?Home Medications ?Prior to Admission medications   ?Medication Sig Start Date End Date Taking? Authorizing Provider  ?indomethacin (INDOCIN) 50 MG capsule Take 1 capsule (50 mg total) by mouth 2 (two) times daily with a meal for 10 days. 08/21/21 08/31/21 Yes Tacy Learn, PA-C  ?ondansetron (ZOFRAN ODT) 4 MG disintegrating tablet Take 1 tablet (4 mg total) by mouth every 8 (eight) hours as needed for nausea or vomiting. 12/03/19   Volanda Napoleon, PA-C  ?promethazine (PHENERGAN) 25 MG tablet Take 1 tablet (25 mg total) by mouth every 8 (eight) hours as needed for up to 20 days for nausea or vomiting. 04/04/20 04/24/20  Lennice Sites, DO  ?famotidine (PEPCID) 20 MG tablet Take 1 tablet (20 mg total) by mouth 2 (two) times daily as needed for heartburn or indigestion. 04/04/20 04/04/20  Lennice Sites, DO  ?   ? ?Allergies    ?Patient has no known allergies.   ? ?Review of Systems   ?Review of Systems ?Negative except as per HPI ?Physical Exam ?Updated Vital Signs ?BP 105/64 (BP Location: Left Arm)   Pulse (!) 50   Temp 97.6 ?F (36.4 ?C) (Oral)   Resp 18   Ht 5\' 9"  (1.753 m)   Wt 88 kg   SpO2 94%   BMI 28.65 kg/m?  ?Physical Exam ?Vitals and nursing note reviewed.  ?Constitutional:   ?   General: He is not in acute distress. ?   Appearance: He is well-developed. He is not  diaphoretic.  ?HENT:  ?   Head: Normocephalic and atraumatic.  ?Cardiovascular:  ?   Pulses: Normal pulses.  ?Pulmonary:  ?   Effort: Pulmonary effort is normal.  ?Musculoskeletal:     ?   General: Swelling and tenderness present. No deformity.  ?   Comments: Swelling, TTP left 1st MTP, ? Warm to the touch  ?Skin: ?   General: Skin is warm and dry.  ?   Findings: No rash.  ?Neurological:  ?   Mental Status: He is alert and oriented to person, place, and time.  ?   Sensory: No sensory deficit.  ?   Motor: No weakness.  ?Psychiatric:     ?   Behavior: Behavior normal.  ? ? ?ED Results / Procedures / Treatments   ?Labs ?(all labs ordered are listed, but only abnormal results are displayed) ?Labs Reviewed - No data to display ? ?EKG ?None ? ?Radiology ?DG Toe Great Left ? ?Result Date: 08/21/2021 ?CLINICAL DATA:  Pain and swelling, no injury. EXAM: LEFT GREAT TOE COMPARISON:  None Available. FINDINGS: No acute osseous or joint abnormality. IMPRESSION: No acute osseous or joint abnormality. Electronically Signed   By: Lorin Picket M.D.   On: 08/21/2021 09:58   ? ?Procedures ?Procedures  ? ? ?Medications Ordered  in ED ?Medications - No data to display ? ?ED Course/ Medical Decision Making/ A&P ?  ?                        ?Medical Decision Making ?Amount and/or Complexity of Data Reviewed ?Radiology: ordered. ? ?Risk ?Prescription drug management. ? ? ?26 year old male with complaint of left great toe pain without injury.  On exam does have swelling with tenderness of the left first MTP, does not appear red, is questionably warm to the touch.  Incidentally does have a darker pigmented stripe through his great toenail, advised to follow-up with PCP or see dermatology if he is never had this evaluated before.  X-rays negative for acute bony injury or degenerative changes.  Plan is to discharge with a stiff supportive shoe, patient requests crutches, advised that he can weight-bear as tolerated.  He is advised to follow-up  with PCP.  Given prescription for indomethacin. ? ? ? ? ? ? ? ?Final Clinical Impression(s) / ED Diagnoses ?Final diagnoses:  ?Pain of left great toe  ? ? ?Rx / DC Orders ?ED Discharge Orders   ? ?      Ordered  ?  indomethacin (INDOCIN) 50 MG capsule  2 times daily with meals       ? 08/21/21 1007  ? ?  ?  ? ?  ? ? ?  ?Tacy Learn, PA-C ?08/21/21 1019 ? ?  ?Lacretia Leigh, MD ?08/22/21 [phone] ? ?

## 2022-12-11 ENCOUNTER — Emergency Department (HOSPITAL_BASED_OUTPATIENT_CLINIC_OR_DEPARTMENT_OTHER)
Admission: EM | Admit: 2022-12-11 | Discharge: 2022-12-11 | Disposition: A | Discharge status: Home / Self Care | Payer: BC Managed Care – PPO | Attending: Emergency Medicine | Admitting: Emergency Medicine

## 2022-12-11 ENCOUNTER — Emergency Department (HOSPITAL_BASED_OUTPATIENT_CLINIC_OR_DEPARTMENT_OTHER): Payer: BC Managed Care – PPO

## 2022-12-11 ENCOUNTER — Other Ambulatory Visit: Payer: Self-pay

## 2022-12-11 DIAGNOSIS — R112 Nausea with vomiting, unspecified: Secondary | ICD-10-CM | POA: Diagnosis present

## 2022-12-11 DIAGNOSIS — K2900 Acute gastritis without bleeding: Secondary | ICD-10-CM | POA: Diagnosis not present

## 2022-12-11 LAB — URINALYSIS, ROUTINE W REFLEX MICROSCOPIC
Glucose, UA: NEGATIVE mg/dL
Ketones, ur: NEGATIVE mg/dL
Leukocytes,Ua: NEGATIVE
Nitrite: NEGATIVE
Protein, ur: >=300 mg/dL — AB
Specific Gravity, Urine: >=1.03 (ref 1.005–1.030)
pH: 6 (ref 5.0–8.0)

## 2022-12-11 LAB — COMPREHENSIVE METABOLIC PANEL
ALT: 19 U/L (ref 0–44)
AST: 25 U/L (ref 15–41)
Albumin: 5.2 g/dL — ABNORMAL HIGH (ref 3.5–5.0)
Alkaline Phosphatase: 111 U/L (ref 38–126)
Anion gap: 12 (ref 5–15)
BUN: 24 mg/dL — ABNORMAL HIGH (ref 6–20)
CO2: 29 mmol/L (ref 22–32)
Calcium: 10.1 mg/dL (ref 8.9–10.3)
Chloride: 94 mmol/L — ABNORMAL LOW (ref 98–111)
Creatinine, Ser: 1.45 mg/dL — ABNORMAL HIGH (ref 0.61–1.24)
GFR, Estimated: >60 mL/min (ref >60)
Glucose, Bld: 134 mg/dL — ABNORMAL HIGH (ref 70–99)
Potassium: 3.6 mmol/L (ref 3.5–5.1)
Sodium: 135 mmol/L (ref 135–145)
Total Bilirubin: 1.4 mg/dL — ABNORMAL HIGH (ref 0.3–1.2)
Total Protein: 10.1 g/dL — ABNORMAL HIGH (ref 6.5–8.1)

## 2022-12-11 LAB — CBC WITH DIFFERENTIAL/PLATELET
Abs Immature Granulocytes: 0.06 10*3/uL (ref 0.00–0.07)
Basophils Absolute: 0 10*3/uL (ref 0.0–0.1)
Basophils Relative: 0 %
Eosinophils Absolute: 0 10*3/uL (ref 0.0–0.5)
Eosinophils Relative: 0 %
HCT: 52.4 % — ABNORMAL HIGH (ref 39.0–52.0)
Hemoglobin: 16.4 g/dL (ref 13.0–17.0)
Immature Granulocytes: 1 %
Lymphocytes Relative: 20 %
Lymphs Abs: 1.8 10*3/uL (ref 0.7–4.0)
MCH: 22.7 pg — ABNORMAL LOW (ref 26.0–34.0)
MCHC: 31.3 g/dL (ref 30.0–36.0)
MCV: 72.7 fL — ABNORMAL LOW (ref 80.0–100.0)
Monocytes Absolute: 0.9 10*3/uL (ref 0.1–1.0)
Monocytes Relative: 10 %
Neutro Abs: 6.4 10*3/uL (ref 1.7–7.7)
Neutrophils Relative %: 69 %
Platelets: 355 10*3/uL (ref 150–400)
RBC: 7.21 MIL/uL — ABNORMAL HIGH (ref 4.22–5.81)
RDW: 17.1 % — ABNORMAL HIGH (ref 11.5–15.5)
WBC: 9.2 10*3/uL (ref 4.0–10.5)
nRBC: 0 % (ref 0.0–0.2)

## 2022-12-11 LAB — URINALYSIS, MICROSCOPIC (REFLEX)

## 2022-12-11 LAB — LIPASE, BLOOD: Lipase: 25 U/L (ref 11–51)

## 2022-12-11 MED ORDER — PANTOPRAZOLE SODIUM 40 MG PO TBEC: 40.0000 mg | DELAYED_RELEASE_TABLET | Freq: Every day | ORAL | 0 refills | Status: AC

## 2022-12-11 MED ORDER — ONDANSETRON HCL 4 MG PO TABS: 4.0000 mg | ORAL_TABLET | Freq: Four times a day (QID) | ORAL | 0 refills | Status: AC

## 2022-12-11 MED ORDER — PANTOPRAZOLE SODIUM 40 MG IV SOLR
40.0000 mg | Freq: Once | INTRAVENOUS | Status: AC
Start: 2022-12-11 — End: 2022-12-11
  Administered 2022-12-11: 40 mg via INTRAVENOUS
  Filled 2022-12-11: qty 10

## 2022-12-11 MED ORDER — SODIUM CHLORIDE 0.9 % IV BOLUS (SEPSIS)
1000.0000 mL | Freq: Once | INTRAVENOUS | Status: AC
  Administered 2022-12-11: 1000 mL via INTRAVENOUS

## 2022-12-11 MED ORDER — SODIUM CHLORIDE 0.9 % IV SOLN: 1000.0000 mL | INTRAVENOUS | Status: DC

## 2022-12-11 MED ORDER — PROCHLORPERAZINE EDISYLATE 10 MG/2ML IJ SOLN
10.0000 mg | Freq: Once | INTRAMUSCULAR | Status: AC
  Administered 2022-12-11: 10 mg via INTRAVENOUS
  Filled 2022-12-11: qty 2

## 2022-12-11 MED ORDER — DIPHENHYDRAMINE HCL 50 MG/ML IJ SOLN
12.5000 mg | Freq: Once | INTRAMUSCULAR | Status: AC
  Administered 2022-12-11: 12.5 mg via INTRAVENOUS
  Filled 2022-12-11: qty 1

## 2022-12-11 NOTE — ED Provider Notes (Signed)
[place] EMERGENCY DEPARTMENT AT [facility] Provider Note   CSN: [id number] Arrival date & time: 12/11/22  2725     History  Chief Complaint  Patient presents with   Vomiting    Sean Benton is a 27 y.o. male.  HPI   Patient has a history of episodes of vomiting and abdominal pain.  Patient states previously he was told could be related to marijuana.  Patient states he stopped using marijuana for about 5 months and he did not really notice any specific change in his symptoms.  He does smoke marijuana regularly.  Once or twice daily.  Patient states however in the last few days he developed episodes of nausea vomiting and abdominal pain.  Patient states he is having some pain in his right upper abdomen.  He has had multiple episodes of vomiting and hiccups.  He has not been able to keep anything down.  And diarrhea but has not had a normal bowel movement in a couple days.  Fevers.  No dysuria.  Home Medications Prior to Admission medications   Medication Sig Start Date End Date Taking? Authorizing Provider  ondansetron (ZOFRAN) 4 MG tablet Take 1 tablet (4 mg total) by mouth every 6 (six) hours. 12/11/22  Yes Linwood Dibbles, MD  pantoprazole (PROTONIX) 40 MG tablet Take 1 tablet (40 mg total) by mouth daily. 12/11/22  Yes Linwood Dibbles, MD  ondansetron (ZOFRAN ODT) 4 MG disintegrating tablet Take 1 tablet (4 mg total) by mouth every 8 (eight) hours as needed for nausea or vomiting. 12/03/19   Maxwell Caul, PA-C  promethazine (PHENERGAN) 25 MG tablet Take 1 tablet (25 mg total) by mouth every 8 (eight) hours as needed for up to 20 days for nausea or vomiting. 04/04/20 04/24/20  Curatolo, Adam, DO  famotidine (PEPCID) 20 MG tablet Take 1 tablet (20 mg total) by mouth 2 (two) times daily as needed for heartburn or indigestion. 04/04/20 04/04/20  Virgina Norfolk, DO      Allergies    Patient has no known allergies.    Review of Systems   Review of Systems  Physical  Exam Updated Vital Signs BP (!) 144/105   Pulse (!) 50   Temp (!) 97.5 F (36.4 C) (Oral)   Resp 18   Ht 1.753 m (5\' 9" )   Wt 79 kg   SpO2 100%   BMI 25.72 kg/m  Physical Exam Vitals and nursing note reviewed.  Constitutional:      Appearance: He is well-developed. He is not diaphoretic.  HENT:     Head: Normocephalic and atraumatic.     Right Ear: External ear normal.     Left Ear: External ear normal.  Eyes:     General: No scleral icterus.       Right eye: No discharge.        Left eye: No discharge.     Conjunctiva/sclera: Conjunctivae normal.  Neck:     Trachea: No tracheal deviation.  Cardiovascular:     Rate and Rhythm: Normal rate and regular rhythm.  Pulmonary:     Effort: Pulmonary effort is normal. No respiratory distress.     Breath sounds: Normal breath sounds. No stridor. No wheezing or rales.  Abdominal:     General: Bowel sounds are normal. There is no distension.     Palpations: Abdomen is soft.     Tenderness: There is no abdominal tenderness. There is no guarding or rebound.     Comments: Frequent  hiccups  Musculoskeletal:        General: No tenderness or deformity.     Cervical back: Neck supple.  Skin:    General: Skin is warm and dry.     Findings: No rash.  Neurological:     General: No focal deficit present.     Mental Status: He is alert.     Cranial Nerves: No cranial nerve deficit, dysarthria or facial asymmetry.     Sensory: No sensory deficit.     Motor: No abnormal muscle tone or seizure activity.     Coordination: Coordination normal.  Psychiatric:        Mood and Affect: Mood normal.     ED Results / Procedures / Treatments   Labs (all labs ordered are listed, but only abnormal results are displayed) Labs Reviewed  COMPREHENSIVE METABOLIC PANEL - Abnormal; Notable for the following components:      Result Value   Chloride 94 (*)    Glucose, Bld 134 (*)    BUN 24 (*)    Creatinine, Ser 1.45 (*)    Total Protein 10.1 (*)     Albumin 5.2 (*)    Total Bilirubin 1.4 (*)    All other components within normal limits  CBC WITH DIFFERENTIAL/PLATELET - Abnormal; Notable for the following components:   RBC 7.21 (*)    HCT 52.4 (*)    MCV 72.7 (*)    MCH 22.7 (*)    RDW 17.1 (*)    All other components within normal limits  URINALYSIS, ROUTINE W REFLEX MICROSCOPIC - Abnormal; Notable for the following components:   Hgb urine dipstick MODERATE (*)    Bilirubin Urine SMALL (*)    Protein, ur >=300 (*)    All other components within normal limits  URINALYSIS, MICROSCOPIC (REFLEX) - Abnormal; Notable for the following components:   Bacteria, UA RARE (*)    All other components within normal limits  LIPASE, BLOOD    EKG None  Radiology DG Abdomen Acute W/Chest  Result Date: 12/11/2022 CLINICAL DATA:  abd pain, vomiting EXAM: DG ABDOMEN ACUTE WITH 1 VIEW CHEST COMPARISON:  None Available. FINDINGS: There is no evidence of dilated bowel loops or free intraperitoneal air. No radiopaque calculi or other significant radiographic abnormality is seen. Heart size and mediastinal contours are within normal limits. Both lungs are clear. IMPRESSION: Negative abdominal radiographs.  No acute cardiopulmonary disease. Electronically Signed   By: Lorenza Cambridge M.D.   On: 12/11/2022 10:07    Procedures Procedures    Medications Ordered in ED Medications  sodium chloride 0.9 % bolus 1,000 mL (1,000 mLs Intravenous New Bag/Given 12/11/22 0939)    Followed by  0.9 %  sodium chloride infusion (has no administration in time range)  prochlorperazine (COMPAZINE) injection 10 mg (10 mg Intravenous Given 12/11/22 0915)  diphenhydrAMINE (BENADRYL) injection 12.5 mg (12.5 mg Intravenous Given 12/11/22 0916)  pantoprazole (PROTONIX) injection 40 mg (40 mg Intravenous Given 12/11/22 0915)    ED Course/ Medical Decision Making/ A&P Clinical Course as of 12/11/22 1038  Thu Dec 11, 2022  1018 Urinalysis not suggestive of infection.   Laboratory test do show elevated BUN and creatinine [JK]  1018 CBC does show elevated hematocrit [JK]  1018 Urinalysis does show increased bilirubin [JK]  1019 Acute abdominal series with or other acute abnormalities [JK]    Clinical Course User Index [JK] Linwood Dibbles, MD  Medical Decision Making Differential diagnosis includes but is not limited to bowel obstruction, gastritis, dehydration covid abnormalities  Problems Addressed: Acute gastritis without hemorrhage, unspecified gastritis type: acute illness or injury that poses a threat to life or bodily functions Nausea and vomiting, unspecified vomiting type: acute illness or injury that poses a threat to life or bodily functions  Amount and/or Complexity of Data Reviewed Labs: ordered. Decision-making details documented in ED Course. Radiology: ordered and independent interpretation performed.  Risk Prescription drug management.   Patient presented to the ED for evaluation of abdominal pain nausea vomiting.  In the ED patient noted to have frequent hiccups but no active vomiting.  Patient has history of similar symptoms in the past.  Records indicate a history of cyclic vomiting syndrome.  There was some question of cannabinoid hyperemesis syndrome.  Patient's exam is reassuring.  He had some mild tenderness in the right upper quadrant.  On repeat exam after IV fluids he has no tenderness.  I have low suspicion for acute cholecystitis.  Prior imaging test did not show any evidence's although it is certainly possible he could have developed abdomen over the last couple of years.  He does not have any focal tenderness.  He does have slight elevation in bilirubin but that has been noted on prior labs.  Patient symptoms improved after antiemetics, antacids and IV fluids.  His labs did suggest a component of dehydration.  I suggest a second liter of IV fluids however patient declined states he is tolerating  p.o.  Discussed possibility that his marijuana use could be contributing to his symptoms.  Recommend follow-up with primary care doctor.  Will discharge home with antacids and antiemetics.  Warning signs precautions discussed        Final Clinical Impression(s) / ED Diagnoses Final diagnoses:  Nausea and vomiting, unspecified vomiting type  Acute gastritis without hemorrhage, unspecified gastritis type    Rx / DC Orders ED Discharge Orders          Ordered    ondansetron (ZOFRAN) 4 MG tablet  Every 6 hours        12/11/22 1035    pantoprazole (PROTONIX) 40 MG tablet  Daily        12/11/22 1035              Linwood Dibbles, MD 12/11/22 1038

## 2022-12-11 NOTE — Discharge Instructions (Addendum)
Take the medications as prescribed.  Follow up with a primary care next thing to be re-checked.  May have some recurrent symptoms of the next day or 2.  Return to the ED for fevers, persistent vomiting

## 2022-12-11 NOTE — ED Triage Notes (Signed)
Pt reports NV, hiccups, that stared Monday morning.  Complains of RUQ abd pain.

## 2023-01-24 IMAGING — CR DG TOE GREAT 2+V*L*
3 series · 3 of 3 positions shown · non-contrast
Comparison: None Available.

CLINICAL DATA: Pain and swelling, no injury.

EXAM:
LEFT GREAT TOE

[x toes ap left]
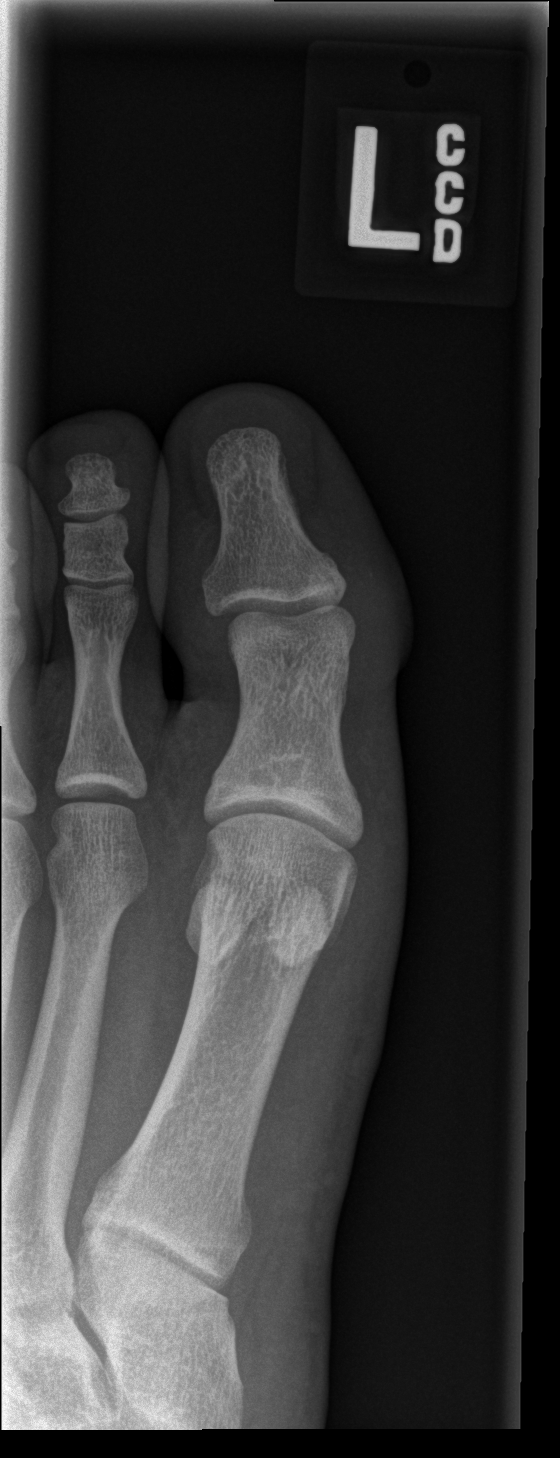

[x toes obl left]
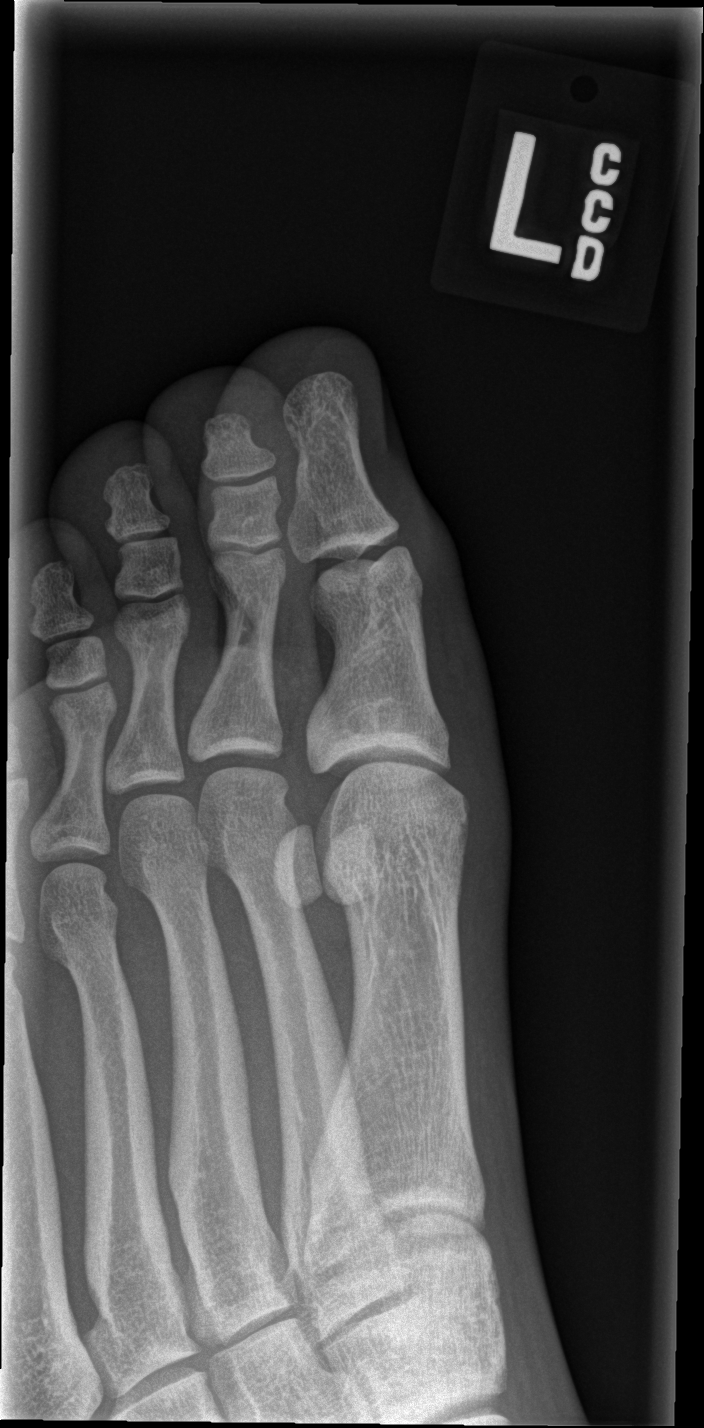

[x toes lat left]
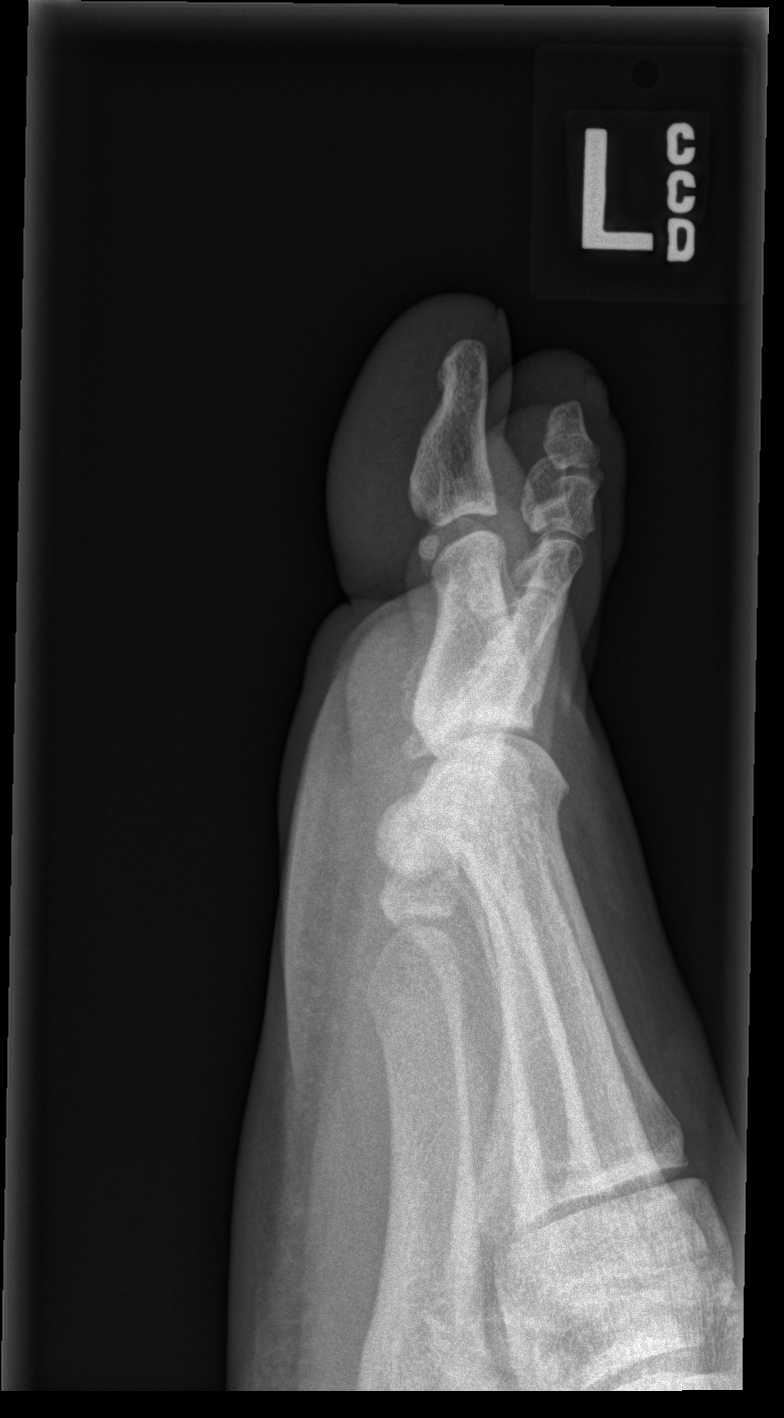

[3 of 3 positions shown; findings below may reference images not displayed]

FINDINGS: No acute osseous or joint abnormality.
IMPRESSION: No acute osseous or joint abnormality.

## 2023-07-08 ENCOUNTER — Encounter (HOSPITAL_BASED_OUTPATIENT_CLINIC_OR_DEPARTMENT_OTHER): Payer: Self-pay

## 2023-07-08 DIAGNOSIS — M25511 Pain in right shoulder: Secondary | ICD-10-CM | POA: Diagnosis present

## 2023-07-08 DIAGNOSIS — M545 Low back pain, unspecified: Secondary | ICD-10-CM | POA: Diagnosis not present

## 2023-07-08 DIAGNOSIS — M542 Cervicalgia: Secondary | ICD-10-CM | POA: Diagnosis not present

## 2023-07-08 NOTE — ED Triage Notes (Signed)
 Rt. Shoulder pain and neck/back pain Reports no injury

## 2023-07-09 ENCOUNTER — Emergency Department (HOSPITAL_BASED_OUTPATIENT_CLINIC_OR_DEPARTMENT_OTHER)
Admission: EM | Admit: 2023-07-09 | Discharge: 2023-07-09 | Disposition: A | Discharge status: Home / Self Care | Attending: Emergency Medicine | Admitting: Emergency Medicine

## 2023-07-09 DIAGNOSIS — M25511 Pain in right shoulder: Secondary | ICD-10-CM | POA: Diagnosis not present

## 2023-07-09 DIAGNOSIS — M545 Low back pain, unspecified: Secondary | ICD-10-CM

## 2023-07-09 MED ORDER — NAPROXEN 250 MG PO TABS
500.0000 mg | ORAL_TABLET | Freq: Once | ORAL | Status: AC
Start: 2023-07-09 — End: 2023-07-09
  Administered 2023-07-09: 500 mg via ORAL
  Filled 2023-07-09: qty 2

## 2023-07-09 MED ORDER — METHOCARBAMOL 500 MG PO TABS: 500.0000 mg | ORAL_TABLET | Freq: Three times a day (TID) | ORAL | 0 refills | Status: DC | PRN

## 2023-07-09 MED ORDER — NAPROXEN 500 MG PO TABS: 500.0000 mg | ORAL_TABLET | Freq: Two times a day (BID) | ORAL | 0 refills | Status: DC

## 2023-07-09 NOTE — ED Provider Notes (Signed)
 [facility]PT [facility] Emergency Department Provider Note MRN:  [id number]  Arrival date & time: 07/09/23     Chief Complaint   shoulder and neck pain   History of Present Illness   Sean Benton is a 28 y.o. year-old male with no pertinent past medical history presenting to the ED with chief complaint of shoulder and neck pain and back pain.  Left lower back pain for the past few days, worse with movement.  No numbness or weakness to the arms or legs, no bowel or bladder dysfunction.  No trauma.  Woke up with right shoulder soreness as well today.  No other trauma, no numbness or weakness, no fever, no other complaints.  Review of Systems  A thorough review of systems was obtained and all systems are negative except as noted in the HPI and PMH.   Patient's Health History    Past Medical History:  Diagnosis Date   Cyclical vomiting     History reviewed. No pertinent surgical history.  History reviewed. No pertinent family history.  Social History   Socioeconomic History   Marital status: Single    Spouse name: Not on file   Number of children: Not on file   Years of education: Not on file   Highest education level: Not on file  Occupational History   Not on file  Tobacco Use   Smoking status: Never   Smokeless tobacco: Never  Vaping Use   Vaping status: Every Day  Substance and Sexual Activity   Alcohol use: Yes    Comment: occ   Drug use: Yes    Types: Marijuana   Sexual activity: Not on file  Other Topics Concern   Not on file  Social History Narrative   Not on file   Social Drivers of Health   Financial Resource Strain: Not on file  Food Insecurity: Not on file  Transportation Needs: Not on file  Physical Activity: Not on file  Stress: Not on file  Social Connections: Not on file  Intimate Partner Violence: Not on file     Physical Exam   Vitals:   07/08/23 2227 07/09/23 0114  BP: 132/68 130/75  Pulse: 62 66  Resp: 18 16   Temp: 97.8 F (36.6 C) 98.3 F (36.8 C)  SpO2: 99% 99%    CONSTITUTIONAL: Well-appearing, NAD NEURO/PSYCH:  Alert and oriented x 3, no focal deficits EYES:  eyes equal and reactive ENT/NECK:  no LAD, no JVD CARDIO: Regular rate, well-perfused, normal S1 and S2 PULM:  CTAB no wheezing or rhonchi GI/GU:  non-distended, non-tender MSK/SPINE:  No gross deformities, no edema SKIN:  no rash, atraumatic   *Additional and/or pertinent findings included in MDM below  Diagnostic and Interventional Summary    EKG Interpretation Date/Time:    Ventricular Rate:    PR Interval:    QRS Duration:    QT Interval:    QTC Calculation:   R Axis:      Text Interpretation:         Labs Reviewed - No data to display  No orders to display    Medications  naproxen (NAPROSYN) tablet 500 mg (500 mg Oral Given 07/09/23 0110)     Procedures  /  Critical Care Procedures  ED Course and Medical Decision Making  Initial Impression and Ddx Patient presenting with 2 separate musculoskeletal complaints.  Shoulder, possibly slept on it awkwardly, no trauma, neurovascularly intact, normal range of motion, no signs of DVT or infection.  Back pain without signs or symptoms of myelopathy, no trauma to warrant imaging.  Overall very well-appearing without concerns for emergent process.  Past medical/surgical history that increases complexity of ED encounter: None  Interpretation of Diagnostics Laboratory and/or imaging options to aid in the diagnosis/care of the patient were considered.  After careful history and physical examination, it was determined that there was no indication for diagnostics at this time.  Patient Reassessment and Ultimate Disposition/Management     Discharge  Patient management required discussion with the following services or consulting groups:  None  Complexity of Problems Addressed Acute complicated illness or Injury  Additional Data Reviewed and Analyzed Further  history obtained from: Further history from spouse/family member  Additional Factors Impacting ED Encounter Risk Prescriptions  Elmer Sow. Pilar Plate, MD [facility] Emergency Medicine [facility] [email or web address]  Final Clinical Impressions(s) / ED Diagnoses     ICD-10-CM   1. Acute pain of right shoulder  M25.511     2. Acute left-sided low back pain without sciatica  M54.50       ED Discharge Orders          Ordered    naproxen (NAPROSYN) 500 MG tablet  2 times daily        07/09/23 0149    methocarbamol (ROBAXIN) 500 MG tablet  Every 8 hours PRN        07/09/23 0150             Discharge Instructions Discussed with and Provided to Patient:    Discharge Instructions      You were evaluated in the Emergency Department and after careful evaluation, we did not find any emergent condition requiring admission or further testing in the hospital.  Your exam/testing today is overall reassuring.  Symptoms likely due to muscular strain or spasm.  Recommend using the Naprosyn twice daily as prescribed for pain.  Can use the Robaxin muscle relaxer for more significant pain, best used at night if you are having trouble sleeping as it can cause drowsiness.  Please return to the Emergency Department if you experience any worsening of your condition.   Thank you for allowing [place] to be a part of your care.      Sabas Sous, MD 07/09/23 [phone]

## 2023-07-09 NOTE — Discharge Instructions (Addendum)
You were evaluated in the Emergency Department and after careful evaluation, we did not find any emergent condition requiring admission or further testing in the hospital.  Your exam/testing today is overall reassuring.  Symptoms likely due to muscular strain or spasm.  Recommend using the Naprosyn twice daily as prescribed for pain.  Can use the Robaxin muscle relaxer for more significant pain, best used at night if you are having trouble sleeping as it can cause drowsiness.  Please return to the Emergency Department if you experience any worsening of your condition.   Thank you for allowing [place] to be a part of your care.

## 2023-08-24 ENCOUNTER — Emergency Department (HOSPITAL_BASED_OUTPATIENT_CLINIC_OR_DEPARTMENT_OTHER)
Admission: EM | Admit: 2023-08-24 | Discharge: 2023-08-25 | Disposition: A | Discharge status: Home / Self Care | Attending: Emergency Medicine | Admitting: Emergency Medicine

## 2023-08-24 ENCOUNTER — Other Ambulatory Visit: Payer: Self-pay

## 2023-08-24 ENCOUNTER — Encounter (HOSPITAL_BASED_OUTPATIENT_CLINIC_OR_DEPARTMENT_OTHER): Payer: Self-pay

## 2023-08-24 DIAGNOSIS — R112 Nausea with vomiting, unspecified: Secondary | ICD-10-CM | POA: Insufficient documentation

## 2023-08-24 DIAGNOSIS — R109 Unspecified abdominal pain: Secondary | ICD-10-CM | POA: Diagnosis not present

## 2023-08-24 DIAGNOSIS — R1110 Vomiting, unspecified: Secondary | ICD-10-CM | POA: Diagnosis present

## 2023-08-24 LAB — CBC
HCT: 47.9 % (ref 39.0–52.0)
Hemoglobin: 15.3 g/dL (ref 13.0–17.0)
MCH: 22.8 pg — ABNORMAL LOW (ref 26.0–34.0)
MCHC: 31.9 g/dL (ref 30.0–36.0)
MCV: 71.4 fL — ABNORMAL LOW (ref 80.0–100.0)
Platelets: 326 10*3/uL (ref 150–400)
RBC: 6.71 MIL/uL — ABNORMAL HIGH (ref 4.22–5.81)
RDW: 16.4 % — ABNORMAL HIGH (ref 11.5–15.5)
WBC: 9.5 10*3/uL (ref 4.0–10.5)
nRBC: 0 % (ref 0.0–0.2)

## 2023-08-24 NOTE — ED Triage Notes (Addendum)
 Pt states he has been vomiting since Friday.  Unable to keep anything down.   C/o abdominal pain middle area

## 2023-08-25 DIAGNOSIS — R112 Nausea with vomiting, unspecified: Secondary | ICD-10-CM | POA: Diagnosis not present

## 2023-08-25 LAB — LIPASE, BLOOD: Lipase: 16 U/L (ref 11–51)

## 2023-08-25 LAB — COMPREHENSIVE METABOLIC PANEL WITH GFR
ALT: 15 U/L (ref 0–44)
AST: 22 U/L (ref 15–41)
Albumin: 5.2 g/dL — ABNORMAL HIGH (ref 3.5–5.0)
Alkaline Phosphatase: 100 U/L (ref 38–126)
Anion gap: 16 — ABNORMAL HIGH (ref 5–15)
BUN: 18 mg/dL (ref 6–20)
CO2: 25 mmol/L (ref 22–32)
Calcium: 11.1 mg/dL — ABNORMAL HIGH (ref 8.9–10.3)
Chloride: 95 mmol/L — ABNORMAL LOW (ref 98–111)
Creatinine, Ser: 1.41 mg/dL — ABNORMAL HIGH (ref 0.61–1.24)
GFR, Estimated: >60 mL/min (ref >60)
Glucose, Bld: 127 mg/dL — ABNORMAL HIGH (ref 70–99)
Potassium: 3.2 mmol/L — ABNORMAL LOW (ref 3.5–5.1)
Sodium: 137 mmol/L (ref 135–145)
Total Bilirubin: 0.9 mg/dL (ref 0.0–1.2)
Total Protein: 9.4 g/dL — ABNORMAL HIGH (ref 6.5–8.1)

## 2023-08-25 LAB — URINALYSIS, ROUTINE W REFLEX MICROSCOPIC
Glucose, UA: NEGATIVE mg/dL
Ketones, ur: NEGATIVE mg/dL
Leukocytes,Ua: NEGATIVE
Nitrite: NEGATIVE
Protein, ur: >=300 mg/dL — AB
Specific Gravity, Urine: >=1.03 (ref 1.005–1.030)
pH: 6 (ref 5.0–8.0)

## 2023-08-25 LAB — URINALYSIS, MICROSCOPIC (REFLEX)

## 2023-08-25 MED ORDER — ONDANSETRON 4 MG PO TBDP: 4.0000 mg | ORAL_TABLET | Freq: Three times a day (TID) | ORAL | 0 refills | Status: AC | PRN

## 2023-08-25 MED ORDER — DROPERIDOL 2.5 MG/ML IJ SOLN
1.2500 mg | Freq: Once | INTRAMUSCULAR | Status: AC
  Administered 2023-08-25: 1.25 mg via INTRAMUSCULAR
  Filled 2023-08-25: qty 2

## 2023-08-25 NOTE — ED Provider Notes (Signed)
 [place] EMERGENCY DEPARTMENT AT [facility]  Provider Note  CSN: [id number] Arrival date & time: 08/24/23 2331  History Chief Complaint  Patient presents with   Emesis   Abdominal Pain    Sean Benton is a 28 y.o. male with history of cyclic vomiting reports 3-4 days of persistent vomiting and upper abdominal pains. No fevers. No diarrhea. Not currently having any nausea or pain. States he has been told in the past his symptoms may be due to [facility] use.    Home Medications Prior to Admission medications   Medication Sig Start Date End Date Taking? Authorizing Provider  ondansetron  (ZOFRAN -ODT) 4 MG disintegrating tablet Take 1 tablet (4 mg total) by mouth every 8 (eight) hours as needed for nausea or vomiting. 08/25/23  Yes Charmayne Cooper, MD  ondansetron  (ZOFRAN ) 4 MG tablet Take 1 tablet (4 mg total) by mouth every 6 (six) hours. 12/11/22   Trish Furl, MD  pantoprazole  (PROTONIX ) 40 MG tablet Take 1 tablet (40 mg total) by mouth daily. 12/11/22   Trish Furl, MD  famotidine  (PEPCID ) 20 MG tablet Take 1 tablet (20 mg total) by mouth 2 (two) times daily as needed for heartburn or indigestion. 04/04/20 04/04/20  Lowery Rue, DO     Allergies    Patient has no known allergies.   Review of Systems   Review of Systems Please see HPI for pertinent positives and negatives  Physical Exam BP 136/88   Pulse (!) 48   Temp 98.2 F (36.8 C)   Resp 20   Ht 5\' 9"  (1.753 m)   Wt 81.6 kg   SpO2 98%   BMI 26.58 kg/m   Physical Exam Vitals and nursing note reviewed.  Constitutional:      Appearance: Normal appearance.  HENT:     Head: Normocephalic and atraumatic.     Nose: Nose normal.     Mouth/Throat:     Mouth: Mucous membranes are moist.  Eyes:     Extraocular Movements: Extraocular movements intact.     Conjunctiva/sclera: Conjunctivae normal.  Cardiovascular:     Rate and Rhythm: Normal rate.  Pulmonary:     Effort: Pulmonary effort is normal.      Breath sounds: Normal breath sounds.  Abdominal:     General: Abdomen is flat.     Palpations: Abdomen is soft.     Tenderness: There is no abdominal tenderness. There is no guarding. Negative signs include Murphy's sign and McBurney's sign.  Musculoskeletal:        General: No swelling. Normal range of motion.     Cervical back: Neck supple.  Skin:    General: Skin is warm and dry.  Neurological:     General: No focal deficit present.     Mental Status: He is alert.  Psychiatric:        Mood and Affect: Mood normal.     ED Results / Procedures / Treatments   EKG None  Procedures Procedures  Medications Ordered in the ED Medications  droperidol  (INAPSINE ) 2.5 MG/ML injection 1.25 mg (1.25 mg Intramuscular Given 08/25/23 0054)    Initial Impression and Plan  Patient here with abdominal pain and vomiting. Exam is reassuring. Labs done in triage show unremarkable CBC. CMP with mildly elevated creatinine, similar to previous. Some hypochloremia and mildly elevated anion gap, consistent with GI losses. Lipase normal. UA without signs of infection. Offered IV medication and IVF but patient declines, states he doesn't like the way  IVF makes him feel. He would prefer IM medications and PO hydration.   ED Course   Clinical Course as of 08/25/23 0131  Tue Aug 25, 2023  0129 Patient feeling better, tolerating PO fluids and ready to go home. Rx for zofran  as needed, continue with oral hydration. PCP follow up, RTED for any other concerns.   [CS]    Clinical Course User Index [CS] Charmayne Cooper, MD     MDM Rules/Calculators/A&P Medical Decision Making Problems Addressed: Nausea and vomiting, unspecified vomiting type: acute illness or injury  Amount and/or Complexity of Data Reviewed Labs: ordered. Decision-making details documented in ED Course.  Risk Prescription drug management.     Final Clinical Impression(s) / ED Diagnoses Final diagnoses:  Nausea and  vomiting, unspecified vomiting type    Rx / DC Orders ED Discharge Orders          Ordered    ondansetron  (ZOFRAN -ODT) 4 MG disintegrating tablet  Every 8 hours PRN        08/25/23 0130             Charmayne Cooper, MD 08/25/23 [phone]

## 2023-08-25 NOTE — ED Notes (Signed)
 Initial contact made. Pt is resting in bed, no distress noted. Pt states he had N/V with RUQ abdominal pain that started on Friday. Pt states he hasn't had recent emesis, but endorses intermittent RUQ when nausea occurs. Currently not having pain or nausea

## 2023-11-17 ENCOUNTER — Emergency Department (HOSPITAL_BASED_OUTPATIENT_CLINIC_OR_DEPARTMENT_OTHER)
Admission: EM | Admit: 2023-11-17 | Discharge: 2023-11-17 | Disposition: A | Discharge status: Home / Self Care | Attending: Emergency Medicine | Admitting: Emergency Medicine

## 2023-11-17 ENCOUNTER — Encounter (HOSPITAL_BASED_OUTPATIENT_CLINIC_OR_DEPARTMENT_OTHER): Payer: Self-pay

## 2023-11-17 ENCOUNTER — Other Ambulatory Visit: Payer: Self-pay

## 2023-11-17 DIAGNOSIS — R1084 Generalized abdominal pain: Secondary | ICD-10-CM | POA: Insufficient documentation

## 2023-11-17 DIAGNOSIS — R112 Nausea with vomiting, unspecified: Secondary | ICD-10-CM | POA: Diagnosis present

## 2023-11-17 DIAGNOSIS — R1115 Cyclical vomiting syndrome unrelated to migraine: Secondary | ICD-10-CM | POA: Insufficient documentation

## 2023-11-17 DIAGNOSIS — R001 Bradycardia, unspecified: Secondary | ICD-10-CM | POA: Diagnosis not present

## 2023-11-17 LAB — COMPREHENSIVE METABOLIC PANEL WITH GFR
ALT: 22 U/L (ref 0–44)
AST: 29 U/L (ref 15–41)
Albumin: 5.4 g/dL — ABNORMAL HIGH (ref 3.5–5.0)
Alkaline Phosphatase: 97 U/L (ref 38–126)
Anion gap: 18 — ABNORMAL HIGH (ref 5–15)
BUN: 16 mg/dL (ref 6–20)
CO2: 22 mmol/L (ref 22–32)
Calcium: 10.9 mg/dL — ABNORMAL HIGH (ref 8.9–10.3)
Chloride: 100 mmol/L (ref 98–111)
Creatinine, Ser: 1.48 mg/dL — ABNORMAL HIGH (ref 0.61–1.24)
GFR, Estimated: >60 mL/min (ref >60)
Glucose, Bld: 165 mg/dL — ABNORMAL HIGH (ref 70–99)
Potassium: 3.6 mmol/L (ref 3.5–5.1)
Sodium: 140 mmol/L (ref 135–145)
Total Bilirubin: 0.6 mg/dL (ref 0.0–1.2)
Total Protein: 8.9 g/dL — ABNORMAL HIGH (ref 6.5–8.1)

## 2023-11-17 LAB — URINALYSIS, ROUTINE W REFLEX MICROSCOPIC
Bilirubin Urine: NEGATIVE
Glucose, UA: NEGATIVE mg/dL
Ketones, ur: NEGATIVE mg/dL
Leukocytes,Ua: NEGATIVE
Nitrite: NEGATIVE
Protein, ur: >=300 mg/dL — AB
Specific Gravity, Urine: >=1.03 (ref 1.005–1.030)
pH: 6 (ref 5.0–8.0)

## 2023-11-17 LAB — CBC
HCT: 47 % (ref 39.0–52.0)
Hemoglobin: 14.9 g/dL (ref 13.0–17.0)
MCH: 22.5 pg — ABNORMAL LOW (ref 26.0–34.0)
MCHC: 31.7 g/dL (ref 30.0–36.0)
MCV: 71.1 fL — ABNORMAL LOW (ref 80.0–100.0)
Platelets: 319 K/uL (ref 150–400)
RBC: 6.61 MIL/uL — ABNORMAL HIGH (ref 4.22–5.81)
RDW: 17.2 % — ABNORMAL HIGH (ref 11.5–15.5)
WBC: 14.2 K/uL — ABNORMAL HIGH (ref 4.0–10.5)
nRBC: 0 % (ref 0.0–0.2)

## 2023-11-17 LAB — URINALYSIS, MICROSCOPIC (REFLEX)

## 2023-11-17 LAB — LIPASE, BLOOD: Lipase: 89 U/L — ABNORMAL HIGH (ref 11–51)

## 2023-11-17 MED ORDER — DIPHENHYDRAMINE HCL 50 MG/ML IJ SOLN
25.0000 mg | Freq: Once | INTRAMUSCULAR | Status: AC
  Administered 2023-11-17: 25 mg via INTRAVENOUS
  Filled 2023-11-17: qty 1

## 2023-11-17 MED ORDER — METOCLOPRAMIDE HCL 10 MG PO TABS: 10.0000 mg | ORAL_TABLET | Freq: Four times a day (QID) | ORAL | 0 refills | Status: AC

## 2023-11-17 MED ORDER — DROPERIDOL 2.5 MG/ML IJ SOLN
1.2500 mg | Freq: Once | INTRAMUSCULAR | Status: AC
  Administered 2023-11-17: 1.25 mg via INTRAVENOUS
  Filled 2023-11-17: qty 2

## 2023-11-17 MED ORDER — LACTATED RINGERS IV BOLUS
1000.0000 mL | Freq: Once | INTRAVENOUS | Status: AC
  Administered 2023-11-17: 1000 mL via INTRAVENOUS

## 2023-11-17 NOTE — ED Notes (Signed)
Pt. Reports he is feeling better.

## 2023-11-17 NOTE — ED Triage Notes (Addendum)
 Pt has been vomiting for the past 2 days, says he's unable to keep anything down. Says he's having discomfort in his stomach. No other symptoms.  Does smoke marijuana. Last smoked 2 days ago.

## 2023-11-17 NOTE — Discharge Instructions (Addendum)
 You were seen for your vomiting in the emergency department.   At home, please refrain from marijuana use. Take the reglan  we have prescribed you for the vomiting.     Check your MyChart online for the results of any tests that had not resulted by the time you left the emergency department.   Follow-up with your primary doctor in 2-3 days regarding your visit.  Follow-up with gastroenterology as well.   Return immediately to the emergency department if you experience any of the following: worsening pain, or any other concerning symptoms.    Thank you for visiting our Emergency Department. It was a pleasure taking care of you today.

## 2023-11-17 NOTE — ED Provider Notes (Signed)
 [place] EMERGENCY DEPARTMENT AT [facility] Provider Note   CSN: [id number] Arrival date & time: 11/17/23  1842     Patient presents with: Emesis   Sean Benton is a 28 y.o. male.   28 year old male with a history of marijuana use who presents to the emergency department with nausea vomiting.  Patient reports for the past 2 days has been having innumerable episodes of vomiting.  Nonbloody nonbilious.  No diarrhea.  No fever.  No significant abdominal pain does have some generalized abdominal cramping.  Says he still smokes marijuana multiple times a week.  Is been treated in the emergency department for cannabinoid hyperemesis in the past.  No abdominal surgeries.  No alcohol or other drug use.       Prior to Admission medications   Medication Sig Start Date End Date Taking? Authorizing Provider  metoCLOPramide  (REGLAN ) 10 MG tablet Take 1 tablet (10 mg total) by mouth every 6 (six) hours. 11/17/23  Yes Yolande Lamar BROCKS, MD  ondansetron  (ZOFRAN ) 4 MG tablet Take 1 tablet (4 mg total) by mouth every 6 (six) hours. 12/11/22   Randol Simmonds, MD  ondansetron  (ZOFRAN -ODT) 4 MG disintegrating tablet Take 1 tablet (4 mg total) by mouth every 8 (eight) hours as needed for nausea or vomiting. 08/25/23   Roselyn Carlin NOVAK, MD  pantoprazole  (PROTONIX ) 40 MG tablet Take 1 tablet (40 mg total) by mouth daily. 12/11/22   Randol Simmonds, MD  famotidine  (PEPCID ) 20 MG tablet Take 1 tablet (20 mg total) by mouth 2 (two) times daily as needed for heartburn or indigestion. 04/04/20 04/04/20  Ruthe Cornet, DO    Allergies: Patient has no known allergies.    Review of Systems  Updated Vital Signs BP (!) 143/90   Pulse (!) 56   Temp 98.5 F (36.9 C) (Oral)   Resp (!) 21   Ht 5' 9 (1.753 m)   Wt 86.2 kg   SpO2 98%   BMI 28.06 kg/m   Physical Exam Vitals and nursing note reviewed.  Constitutional:      General: He is not in acute distress.    Appearance: He is well-developed.      Comments: Wretching  Cardiovascular:     Rate and Rhythm: Regular rhythm. Bradycardia present.  Pulmonary:     Effort: Pulmonary effort is normal. No respiratory distress.  Abdominal:     General: There is no distension.     Palpations: Abdomen is soft. There is no mass.     Tenderness: There is no abdominal tenderness. There is no guarding.  Skin:    General: Skin is warm and dry.  Psychiatric:        Mood and Affect: Mood normal.        Behavior: Behavior normal.     (all labs ordered are listed, but only abnormal results are displayed) Labs Reviewed  LIPASE, BLOOD - Abnormal; Notable for the following components:      Result Value   Lipase 89 (*)    All other components within normal limits  COMPREHENSIVE METABOLIC PANEL WITH GFR - Abnormal; Notable for the following components:   Glucose, Bld 165 (*)    Creatinine, Ser 1.48 (*)    Calcium 10.9 (*)    Total Protein 8.9 (*)    Albumin 5.4 (*)    Anion gap 18 (*)    All other components within normal limits  CBC - Abnormal; Notable for the following components:   WBC 14.2 (*)  RBC 6.61 (*)    MCV 71.1 (*)    MCH 22.5 (*)    RDW 17.2 (*)    All other components within normal limits  URINALYSIS, ROUTINE W REFLEX MICROSCOPIC - Abnormal; Notable for the following components:   Hgb urine dipstick MODERATE (*)    Protein, ur >=300 (*)    All other components within normal limits  URINALYSIS, MICROSCOPIC (REFLEX) - Abnormal; Notable for the following components:   Bacteria, UA FEW (*)    All other components within normal limits    EKG: None  Radiology: No results found.   Procedures   Medications Ordered in the ED  droperidol  (INAPSINE ) 2.5 MG/ML injection 1.25 mg (1.25 mg Intravenous Given 11/17/23 1925)  diphenhydrAMINE  (BENADRYL ) injection 25 mg (25 mg Intravenous Given 11/17/23 1925)  lactated ringers  bolus 1,000 mL (0 mLs Intravenous Stopped 11/17/23 2021)    Clinical Course as of 11/17/23 2318  Tue Nov 17, 2023  2034 Creatinine(!): 1.48 At baseline [RP]    Clinical Course User Index [RP] Yolande Lamar BROCKS, MD                                 Medical Decision Making Amount and/or Complexity of Data Reviewed Labs: ordered. Decision-making details documented in ED Course.  Risk Prescription drug management.   28 year old male with a history of marijuana use who presents to the emergency department with nausea vomiting.   Initial Ddx:  Cyclical vomiting, cannabinoid hyperemesis, AKI, electrolyte abnormality, pancreatitis, gastroenteritis  MDM/Course:  Patient presents emergency department with nausea and vomiting.  Does smoke marijuana multiple times a day.  Has a history of cannabinoid hyperemesis.  Says that he has had severe nausea and vomiting for the past 2 days.  On exam is retching and appears very uncomfortable.  No significant abdominal tenderness to palpation.  He had blood work that showed no signs of AKI.  White blood cell count was minimally elevated at 14 which I suspect is elevation due to stress response.  Lipase was only minimally elevated but not what I would expect from pancreatitis.  Was given IV fluids and droperidol  and upon re-evaluation was tolerating p.o. and is feeling much better.  Though the patient continues to smoke marijuana family member was at the bedside and is very convinced that it is not just the marijuana that is causing his symptoms.  Explained is quite possible that he could have cyclical vomiting from another cause but that he needs to stop smoking marijuana at this point in time.  Will have him follow-up with his primary doctor and GI for continued evaluation.  This patient presents to the ED for concern of complaints listed in HPI, this involves an extensive number of treatment options, and is a complaint that carries with it a high risk of complications and morbidity. Disposition including potential need for admission considered.   Dispo: DC Home.  Return precautions discussed including, but not limited to, those listed in the AVS. Allowed pt time to ask questions which were answered fully prior to dc.  Additional history obtained from family Records reviewed Outpatient Clinic Notes The following labs were independently interpreted: Chemistry and show AKI I personally reviewed and interpreted cardiac monitoring: normal sinus rhythm  I personally reviewed and interpreted the pt's EKG: see above for interpretation  I have reviewed the patients home medications and made adjustments as needed  Portions of this note were  generated with Scientist, clinical (histocompatibility and immunogenetics). Dictation errors may occur despite best attempts at proofreading.     Final diagnoses:  Nausea and vomiting, unspecified vomiting type  Cyclical vomiting    ED Discharge Orders          Ordered    metoCLOPramide  (REGLAN ) 10 MG tablet  Every 6 hours        11/17/23 2125    Ambulatory referral to Gastroenterology        11/17/23 2126               Yolande Lamar BROCKS, MD 11/17/23 2318
